# Patient Record
Sex: Female | Born: 1965 | Race: Black or African American | Hispanic: No | Marital: Married | Smoking: Never smoker
Health system: Southern US, Community
[De-identification: ages and names within clinical notes are randomized; demographics above are authoritative.]

## PROBLEM LIST (undated history)

## (undated) DIAGNOSIS — I10 Essential (primary) hypertension: Secondary | ICD-10-CM

## (undated) DIAGNOSIS — E119 Type 2 diabetes mellitus without complications: Secondary | ICD-10-CM

---

## 2007-07-31 ENCOUNTER — Emergency Department: Payer: Self-pay | Admitting: Emergency Medicine

## 2009-03-27 ENCOUNTER — Ambulatory Visit: Payer: Self-pay | Admitting: Family Medicine

## 2009-04-01 ENCOUNTER — Encounter: Payer: Self-pay | Admitting: Family Medicine

## 2009-04-01 ENCOUNTER — Ambulatory Visit: Payer: Self-pay | Admitting: Obstetrics & Gynecology

## 2009-04-01 LAB — CONVERTED CEMR LAB
Basophils Relative: 0 % (ref 0–1)
Eosinophils Absolute: 0 10*3/uL (ref 0.0–0.7)
Hepatitis B Surface Ag: NEGATIVE
MCHC: 32.4 g/dL (ref 30.0–36.0)
MCV: 92.2 fL (ref 78.0–100.0)
Neutro Abs: 2.8 10*3/uL (ref 1.7–7.7)
Neutrophils Relative %: 53 % (ref 43–77)
Platelets: 258 10*3/uL (ref 150–400)
RBC: 3.95 M/uL (ref 3.87–5.11)
WBC: 5.2 10*3/uL (ref 4.0–10.5)
hCG, Beta Chain, Quant, S: 380.6 milliintl units/mL

## 2009-04-03 ENCOUNTER — Ambulatory Visit: Payer: Self-pay | Admitting: Family Medicine

## 2009-04-03 LAB — CONVERTED CEMR LAB: hCG, Beta Chain, Quant, S: 324.7 milliintl units/mL

## 2009-04-08 ENCOUNTER — Encounter: Payer: Self-pay | Admitting: Family Medicine

## 2009-04-08 ENCOUNTER — Ambulatory Visit: Payer: Self-pay | Admitting: Obstetrics and Gynecology

## 2009-04-08 LAB — CONVERTED CEMR LAB: hCG, Beta Chain, Quant, S: 182 milliintl units/mL

## 2009-04-13 ENCOUNTER — Ambulatory Visit: Payer: Self-pay | Admitting: Family Medicine

## 2009-04-13 LAB — CONVERTED CEMR LAB: hCG, Beta Chain, Quant, S: 90.8 milliintl units/mL

## 2009-04-24 ENCOUNTER — Ambulatory Visit: Payer: Self-pay | Admitting: Obstetrics & Gynecology

## 2009-04-24 ENCOUNTER — Encounter: Payer: Self-pay | Admitting: Family Medicine

## 2009-04-24 LAB — CONVERTED CEMR LAB: hCG, Beta Chain, Quant, S: 49.8 milliintl units/mL

## 2009-06-22 ENCOUNTER — Encounter: Payer: Self-pay | Admitting: Family Medicine

## 2009-06-22 ENCOUNTER — Ambulatory Visit: Payer: Self-pay | Admitting: Obstetrics & Gynecology

## 2009-11-16 ENCOUNTER — Emergency Department: Payer: Self-pay | Admitting: Emergency Medicine

## 2010-03-21 ENCOUNTER — Emergency Department: Payer: Self-pay | Admitting: Emergency Medicine

## 2010-05-05 ENCOUNTER — Ambulatory Visit: Payer: Self-pay | Admitting: Family Medicine

## 2012-03-27 ENCOUNTER — Emergency Department: Payer: Self-pay | Admitting: Emergency Medicine

## 2012-04-08 ENCOUNTER — Emergency Department: Payer: Self-pay | Admitting: Emergency Medicine

## 2013-10-27 ENCOUNTER — Emergency Department: Payer: Self-pay | Admitting: Internal Medicine

## 2014-01-10 ENCOUNTER — Emergency Department: Payer: Self-pay | Admitting: Emergency Medicine

## 2015-01-27 ENCOUNTER — Emergency Department: Payer: Self-pay | Admitting: Emergency Medicine

## 2015-08-12 ENCOUNTER — Emergency Department
Admission: EM | Admit: 2015-08-12 | Discharge: 2015-08-12 | Disposition: A | Discharge status: Home / Self Care | Payer: Self-pay | Attending: Emergency Medicine | Admitting: Emergency Medicine

## 2015-08-12 DIAGNOSIS — L600 Ingrowing nail: Secondary | ICD-10-CM | POA: Insufficient documentation

## 2015-08-12 DIAGNOSIS — Y998 Other external cause status: Secondary | ICD-10-CM | POA: Insufficient documentation

## 2015-08-12 DIAGNOSIS — Y9389 Activity, other specified: Secondary | ICD-10-CM | POA: Insufficient documentation

## 2015-08-12 DIAGNOSIS — Y9289 Other specified places as the place of occurrence of the external cause: Secondary | ICD-10-CM | POA: Insufficient documentation

## 2015-08-12 DIAGNOSIS — W228XXA Striking against or struck by other objects, initial encounter: Secondary | ICD-10-CM | POA: Insufficient documentation

## 2015-08-12 MED ORDER — MELOXICAM 15 MG PO TABS: 15.0000 mg | ORAL_TABLET | Freq: Every day | ORAL | Status: DC

## 2015-08-12 NOTE — Discharge Instructions (Signed)
Ingrown Toenail An ingrown toenail occurs when the sharp edge of your toenail grows into the skin. Causes of ingrown toenails include toenails clipped too far back or poorly fitting shoes. Activities involving sudden stops (basketball, tennis) causing "toe jamming" may lead to an ingrown nail. HOME CARE INSTRUCTIONS   Soak the whole foot in warm soapy water for 20 minutes, 3 times per day.  You may lift the edge of the nail away from the sore skin by wedging a small piece of cotton under the corner of the nail. Be careful not to dig (traumatize) and cause more injury to the area.  Wear shoes that fit well. While the ingrown nail is causing problems, sandals may be beneficial.  Trim your toenails regularly and carefully. Cut your toenails straight across, not in a curve. This will prevent injury to the skin at the corners of the toenail.  Keep your feet clean and dry.  Crutches may be helpful early in treatment if walking is painful.  Antibiotics, if prescribed, should be taken as directed.  Return for a wound check in 2 days or as directed.  Only take over-the-counter or prescription medicines for pain, discomfort, or fever as directed by your caregiver. SEEK IMMEDIATE MEDICAL CARE IF:   You have a fever.  You have increasing pain, redness, swelling, or heat at the wound site.  Your toe is not better in 7 days. If conservative treatment is not successful, surgical removal of a portion or all of the nail may be necessary. MAKE SURE YOU:   Understand these instructions.  Will watch your condition.  Will get help right away if you are not doing well or get worse. Document Released: 11/04/2000 Document Revised: 01/30/2012 Document Reviewed: 10/29/2008 [employer] Patient Information 2015 [employer]. This information is not intended to replace advice given to you by your health care provider. Make sure you discuss any questions you have with your health care provider.  

## 2015-08-12 NOTE — ED Provider Notes (Signed)
[facility] Emergency Department Provider Note  ____________________________________________  Time seen: Approximately 6:19 PM  I have reviewed the triage vital signs and the nursing notes.   HISTORY  Chief Complaint Toe Pain    HPI Brittney Rojas is a 49 y.o. female who presents emergency department for complaint of toe pain on right foot. She states thatshe accidentally kicked a door jamb approximately a week ago and now is having pain to the second toe right foot. She states that the pain is all on the lateral and above the nailbed. She denies any gross edema, erythema, or drainage. She has not taken anything for pain prior to arrival. There is no ecchymosis or bruising. There is no pain in the intervening period between injury and presentation.   No past medical history on file.  There are no active problems to display for this patient.   No past surgical history on file.  Current Outpatient Rx  Name  Route  Sig  Dispense  Refill  . meloxicam (MOBIC) 15 MG tablet   Oral   Take 1 tablet (15 mg total) by mouth daily.   30 tablet   0     Allergies Review of patient's allergies indicates no known allergies.  No family history on file.  Social History Social History  Substance Use Topics  . Smoking status: Not on file  . Smokeless tobacco: Not on file  . Alcohol Use: Not on file    Review of Systems Constitutional: No fever/chills Eyes: No visual changes. ENT: No sore throat. Cardiovascular: Denies chest pain. Respiratory: Denies shortness of breath. Gastrointestinal: No abdominal pain.  No nausea, no vomiting.  No diarrhea.  No constipation. Genitourinary: Negative for dysuria. Musculoskeletal: Negative for back pain. Pain to the second toe on the right foot lateral aspect. Skin: Negative for rash.  Neurological: Negative for headaches, focal weakness or numbness.  10-point ROS otherwise  negative.  ____________________________________________   PHYSICAL EXAM:  VITAL SIGNS: ED Triage Vitals  Enc Vitals Group     BP 08/12/15 1733 141/89 mmHg     Pulse Rate 08/12/15 1733 82     Resp 08/12/15 1733 17     Temp 08/12/15 1733 97.9 F (36.6 C)     Temp Source 08/12/15 1733 Oral     SpO2 08/12/15 1733 100 %     Weight 08/12/15 1733 170 lb (77.111 kg)     Height 08/12/15 1733  (1.626 m)     Head Cir --      Peak Flow --      Pain Score 08/12/15 1748 6     Pain Loc --      Pain Edu? --      Excl. in GC? --     Constitutional: Alert and oriented. Well appearing and in no acute distress. Eyes: Conjunctivae are normal. PERRL. EOMI. Head: Atraumatic. Nose: No congestion/rhinnorhea. Mouth/Throat: Mucous membranes are moist.  Oropharynx non-erythematous. Neck: No stridor.   Cardiovascular: Normal rate, regular rhythm. Grossly normal heart sounds.  Good peripheral circulation. Respiratory: Normal respiratory effort.  No retractions. Lungs CTAB. Gastrointestinal: Soft and nontender. No distention. No abdominal bruits. No CVA tenderness. Musculoskeletal: No lower extremity tenderness nor edema.  No joint effusions. Neurologic:  Normal speech and language. No gross focal neurologic deficits are appreciated. No gait instability. Skin:  Skin is warm, dry and intact. No rash noted. Mild erythema noted to the lateral aspect of the nailbed of the second toe on the right  foot. No fluctuance noted. No edema noted. No drainage noted. Psychiatric: Mood and affect are normal. Speech and behavior are normal.  ____________________________________________   LABS (all labs ordered are listed, but only abnormal results are displayed)  Labs Reviewed - No data to display ____________________________________________  EKG   ____________________________________________  RADIOLOGY   ____________________________________________   PROCEDURES  Procedure(s) performed:  None  Critical Care performed: No  ____________________________________________   INITIAL IMPRESSION / ASSESSMENT AND PLAN / ED COURSE  Pertinent labs & imaging results that were available during my care of the patient were reviewed by me and considered in my medical decision making (see chart for details).  Patient's history, symptoms, and exam most consistent with a mildly ingrown toenail. No signs of infection at this time. Discussed options with patient she states that she would like to treat with anti-inflammatory/pain medication and refer toenail to grow out. She does not want removal at this time. We'll prescribe anti-inflammatories for patient and gave instructions on how to have proper nail care. Patient verbalizes understanding and states compliance with structures.  FINAL CLINICAL IMPRESSION(S) / ED DIAGNOSES  Final diagnoses:  Ingrown toenail without infection      Racheal Patches, PA-C 08/12/15 1900  Sharyn Creamer, MD 08/12/15 2217

## 2015-08-12 NOTE — ED Notes (Signed)
Possible ingrown toenail

## 2016-10-21 ENCOUNTER — Emergency Department: Payer: Federal, State, Local not specified - PPO

## 2016-10-21 ENCOUNTER — Emergency Department
Admission: EM | Admit: 2016-10-21 | Discharge: 2016-10-21 | Disposition: A | Discharge status: Home / Self Care | Payer: Federal, State, Local not specified - PPO | Attending: Emergency Medicine | Admitting: Emergency Medicine

## 2016-10-21 ENCOUNTER — Encounter: Payer: Self-pay | Admitting: Emergency Medicine

## 2016-10-21 DIAGNOSIS — M20011 Mallet finger of right finger(s): Secondary | ICD-10-CM | POA: Insufficient documentation

## 2016-10-21 DIAGNOSIS — M79644 Pain in right finger(s): Secondary | ICD-10-CM | POA: Diagnosis present

## 2016-10-21 MED ORDER — MELOXICAM 7.5 MG PO TABS: 7.5000 mg | ORAL_TABLET | Freq: Every day | ORAL | 2 refills | Status: AC

## 2016-10-21 MED ORDER — KETOROLAC TROMETHAMINE 60 MG/2ML IM SOLN
60.0000 mg | Freq: Once | INTRAMUSCULAR | Status: AC
  Administered 2016-10-21: 60 mg via INTRAMUSCULAR
  Filled 2016-10-21: qty 2

## 2016-10-21 NOTE — ED Triage Notes (Signed)
Pt ambulatory to triage with no difficulty. Pt reports about 45 mins ago she was cleaning in her car when she heard a pop in her right hand 3rd digit. Finger is swollen and painful.

## 2016-10-22 NOTE — ED Provider Notes (Signed)
[facility] Emergency Department Provider Note ____________________________________________  Time seen: Approximately 12:23 AM  I have reviewed the triage vital signs and the nursing notes.   HISTORY  Chief Complaint Finger Injury    HPI Brittney Rojas is a 50 y.o. female presenting with pain and edema of the right third digit. Patient states that she was cleaning when she heard a "pop". Digit became painful. She denies prior incidences of trauma or surgeries to the aforementioned digit. She is experiencing hand avoidence. Pain is currently 5 out of 10 in intensity and described as throbbing. She has not attempted alleviating measures. 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  No past medical history on file.  There are no active problems to display for this patient.   No past surgical history on file.  Prior to Admission medications   Medication Sig Start Date End Date Taking? Authorizing Provider  meloxicam (MOBIC) 7.5 MG tablet Take 1 tablet (7.5 mg total) by mouth daily. 10/21/16 10/21/17  Orvil FeilJaclyn M Yuval Nolet, PA-C    Allergies Patient has no known allergies.  No family history on file.  Social History Social History  Substance Use Topics  . Smoking status: Not on file  . Smokeless tobacco: Not on file  . Alcohol use Not on file    Review of Systems Constitutional: No recent illness. Cardiovascular: Denies chest pain or palpitations. Respiratory: Denies shortness of breath. Musculoskeletal: Pain in right 3rd finger.  Skin: Negative for rash, wound, lesion. Neurological: Negative for focal weakness or numbness.  ____________________________________________   PHYSICAL EXAM:  VITAL SIGNS: ED Triage Vitals  Enc Vitals Group     BP 10/21/16 1935 (!) 143/86     Pulse Rate 10/21/16 1935 73     Resp 10/21/16 1935 18     Temp 10/21/16 1935 97.5 F (36.4 C)     Temp Source 10/21/16 1935 Oral     SpO2 10/21/16  1935 96 %     Weight 10/21/16 1926 170 lb (77.1 kg)     Height 10/21/16 1926 5\' 4"  (1.626 m)     Head Circumference --      Peak Flow --      Pain Score 10/21/16 1926 7     Pain Loc --      Pain Edu? --      Excl. in GC? --     Constitutional: Alert and oriented. Well appearing and in no acute distress. Eyes: Conjunctivae are normal. EOMI. Head: Atraumatic. Neck: No stridor.  Respiratory: Normal respiratory effort.   Musculoskeletal: 3rd right digit is edematous. Patient has flexion at the DIP and PIP joint within reference range, right 3rd digit. Patient has approximately 8 of extension lag at the DIP joint, right 3rd digit. Patient exhibits full range of motion at the wrist. Radial and ulnar pulses are palpable.  Neurologic:  Normal speech and language. No gross focal neurologic deficits are appreciated. Speech is normal. No gait instability. Skin:  Skin is warm, dry and intact. Atraumatic.  Psychiatric: Mood and affect are normal. Speech and behavior are normal.  ____________________________________________   LABS (all labs ordered are listed, but only abnormal results are displayed)  Labs Reviewed - No data to display ____________________________________________  RADIOLOGY  There is no evidence of fracture or dislocation. There is no  evidence of arthropathy or other focal bone abnormality. Soft  tissues are unremarkable.     PROCEDURES  Procedure(s) performed:  Toradol  Splint application    ____________________________________________   INITIAL IMPRESSION / ASSESSMENT AND PLAN / ED COURSE  Clinical Course     Pertinent labs & imaging results that were available during my care of the patient were reviewed by me and considered in my medical decision making (see chart for details).  Assessment and plan: Patient has 8 of extension lag in the right third digit, consistent with a mallet injury. X-rays of the affected digit did not reveal fractures or  dislocations. Patient's right third finger was splinted in extension. A referral was made to the orthopedist on-call, Dr. Rosita KeaMenz Patient was advised to make an appointment for Monday morning. Mobic was prescribed to be used as needed for pain and inflammation. Patient's vital signs are reassuring at this time. All patient questions were answered.  NAL CLINICAL IMPRESSION(S) / ED DIAGNOSES  Final diagnoses:  Mallet finger of right hand       Orvil FeilJaclyn M Kc Summerson, PA-C 10/22/16 0032    Phineas SemenGraydon Goodman, MD 10/23/16 [phone]

## 2016-12-03 ENCOUNTER — Emergency Department
Admission: EM | Admit: 2016-12-03 | Discharge: 2016-12-03 | Disposition: A | Discharge status: Home / Self Care | Payer: Federal, State, Local not specified - PPO | Attending: Emergency Medicine | Admitting: Emergency Medicine

## 2016-12-03 ENCOUNTER — Encounter: Payer: Self-pay | Admitting: Emergency Medicine

## 2016-12-03 DIAGNOSIS — J111 Influenza due to unidentified influenza virus with other respiratory manifestations: Secondary | ICD-10-CM | POA: Insufficient documentation

## 2016-12-03 DIAGNOSIS — R05 Cough: Secondary | ICD-10-CM | POA: Diagnosis present

## 2016-12-03 DIAGNOSIS — J069 Acute upper respiratory infection, unspecified: Secondary | ICD-10-CM

## 2016-12-03 LAB — INFLUENZA PANEL BY PCR (TYPE A & B)
Influenza A By PCR: POSITIVE — AB
Influenza B By PCR: NEGATIVE

## 2016-12-03 MED ORDER — BENZONATATE 100 MG PO CAPS: 100.0000 mg | ORAL_CAPSULE | Freq: Three times a day (TID) | ORAL | 0 refills | Status: DC | PRN

## 2016-12-03 MED ORDER — ACETAMINOPHEN-CODEINE #3 300-30 MG PO TABS
1.0000 | ORAL_TABLET | Freq: Once | ORAL | Status: AC
  Administered 2016-12-03: 1 via ORAL
  Filled 2016-12-03: qty 1

## 2016-12-03 MED ORDER — ACETAMINOPHEN-CODEINE #3 300-30 MG PO TABS: 1.0000 | ORAL_TABLET | Freq: Three times a day (TID) | ORAL | 0 refills | Status: DC | PRN

## 2016-12-03 MED ORDER — ONDANSETRON 4 MG PO TBDP: 4.0000 mg | ORAL_TABLET | Freq: Four times a day (QID) | ORAL | 0 refills | Status: DC | PRN

## 2016-12-03 NOTE — ED Notes (Signed)
See triage ntote  Developed body aches and headache on weds  Unsure of fever  But has had chills  Positive cough no n/v

## 2016-12-03 NOTE — Discharge Instructions (Signed)
You likely have the flu (influenza virus infection). You may have symptoms for several more days. It is important to stay hydrated during this illness. You may take the prescription meds as directed. In addition, you should dose OTC Delsym (dextromethorphan) cough syrup. You may also take Ibuprofen in addition to the Tylenol w/ codeine you have been prescribed. Follow-up with your provider or return to the ED if symptoms worsen.

## 2016-12-03 NOTE — ED Triage Notes (Signed)
Patient presents to the ED with cough, congestion, body aches and cold chills since Wednesday.  Patient reports never taking her temp. But feeling that she was very hot and very cold.  Patient is in no obvious distress at this time.

## 2016-12-04 NOTE — ED Provider Notes (Signed)
[facility] Emergency Department Provider Note ____________________________________________  Time seen: 1247  I have reviewed the triage vital signs and the nursing notes.  HISTORY  Chief Complaint  Fever; Headache; and Nasal Congestion  HPI Brittney Rojas is a 51 y.o. female presents to the ED for evaluation of cough, congestion, and body aches since Wednesday. Patient is not taking her temperature but she has had subjective fevers and chills. She is taken Alka-Seltzer elixir for symptom relief but notes limited benefit. She denies any nausea, or vomiting in the interim. She did not receive the seasonal flu vaccine.  History reviewed. No pertinent past medical history.  There are no active problems to display for this patient.  History reviewed. No pertinent surgical history.  Prior to Admission medications   Medication Sig Start Date End Date Taking? Authorizing Provider  acetaminophen-codeine (TYLENOL #3) 300-30 MG tablet Take 1 tablet by mouth every 8 (eight) hours as needed for moderate pain. 12/03/16   Sergei Delo V Bacon Kiira Brach, PA-C  benzonatate (TESSALON PERLES) 100 MG capsule Take 1 capsule (100 mg total) by mouth 3 (three) times daily as needed for cough (Take 1-2 per dose). 12/03/16   Jayceon Troy V Bacon Mackenzye Mackel, PA-C  meloxicam (MOBIC) 7.5 MG tablet Take 1 tablet (7.5 mg total) by mouth daily. 10/21/16 10/21/17  Dayna BarkerJaclyn M Woods, PA-C  ondansetron (ZOFRAN ODT) 4 MG disintegrating tablet Take 1 tablet (4 mg total) by mouth every 6 (six) hours as needed for nausea or vomiting. 12/03/16   Charlesetta IvoryJenise V Bacon Maud Rubendall, PA-C   Allergies Patient has no known allergies.  No family history on file.  Social History Social History  Substance Use Topics  . Smoking status: Never Smoker  . Smokeless tobacco: Never Used  . Alcohol use No    Review of Systems  Constitutional: Positive for subjective fevers. Eyes: Negative for visual changes. ENT: Negative for sore  throat. Cardiovascular: Negative for chest pain. Respiratory: Negative for shortness of breath. Reports cough as above Gastrointestinal: Negative for abdominal pain, vomiting and diarrhea. Musculoskeletal: Negative for back pain. Reports bodyaches Skin: Negative for rash. Neurological: Negative for headaches, focal weakness or numbness. ____________________________________________  PHYSICAL EXAM:  VITAL SIGNS: ED Triage Vitals [12/03/16 1141]  Enc Vitals Group     BP (!) 151/77     Pulse Rate (!) 106     Resp 18     Temp 98.2 F (36.8 C)     Temp Source Oral     SpO2 95 %     Weight 170 lb (77.1 kg)     Height 5\' 4"  (1.626 m)     Head Circumference      Peak Flow      Pain Score 7     Pain Loc      Pain Edu?      Excl. in GC?    Constitutional: Alert and oriented. Well appearing and in no distress. Head: Normocephalic and atraumatic. Eyes: Conjunctivae are normal. PERRL. Normal extraocular movements Ears: Canals clear. TMs intact bilaterally. Nose: No congestion/rhinorrhea/epistaxis. Mouth/Throat: Mucous membranes are moist. Uvula is midline and tonsils are flat. Neck: Supple. No thyromegaly. Hematological/Lymphatic/Immunological: No cervical lymphadenopathy. Cardiovascular: Normal rate, regular rhythm. Normal distal pulses. Respiratory: Normal respiratory effort. No wheezes/rales/rhonchi. Gastrointestinal: Soft and nontender. No distention. Musculoskeletal: Nontender with normal range of motion in all extremities.  Neurologic:  Normal gait without ataxia. Normal speech and language. No gross focal neurologic deficits are appreciated. Skin:  Skin is warm, dry and intact. No  rash noted. Psychiatric: Mood and affect are normal. Patient exhibits appropriate insight and judgment. ____________________________________________    LABS (pertinent positives/negatives) Labs Reviewed  INFLUENZA PANEL BY PCR (TYPE A & B, H1N1) - Abnormal; Notable for the following:       Result  Value   Influenza A By PCR POSITIVE (*)    All other components within normal limits  ___________________________________________  PROCEDURES  Tylenol #3 i PO ____________________________________________  INITIAL IMPRESSION / ASSESSMENT AND PLAN / ED COURSE  Patient with clinical picture and laboratory confirmation of influenza A. She is discharged with prescriptions for Tessalon pearls, Zofran, and Tylenol #3. She is encouraged to rest, hydrate, and take over-the-counter cough medicine. She will follow-up with her primary care provider for continued and ongoing symptoms. Return precautions are reviewed.  Clinical Course    ____________________________________________  FINAL CLINICAL IMPRESSION(S) / ED DIAGNOSES  Final diagnoses:  Viral upper respiratory tract infection  Influenza      Lissa Hoard, PA-C 12/04/16 1916    Sharman Cheek, MD 12/04/16 2028

## 2017-05-31 ENCOUNTER — Other Ambulatory Visit: Payer: Self-pay | Admitting: Physician Assistant

## 2017-05-31 DIAGNOSIS — Z1231 Encounter for screening mammogram for malignant neoplasm of breast: Secondary | ICD-10-CM

## 2017-07-10 ENCOUNTER — Ambulatory Visit
Admission: RE | Admit: 2017-07-10 | Discharge: 2017-07-10 | Disposition: A | Discharge status: Home / Self Care | Payer: Federal, State, Local not specified - PPO | Source: Ambulatory Visit | Attending: Physician Assistant | Admitting: Physician Assistant

## 2017-07-10 DIAGNOSIS — Z1231 Encounter for screening mammogram for malignant neoplasm of breast: Secondary | ICD-10-CM | POA: Insufficient documentation

## 2017-07-10 DIAGNOSIS — R928 Other abnormal and inconclusive findings on diagnostic imaging of breast: Secondary | ICD-10-CM | POA: Diagnosis not present

## 2017-07-14 ENCOUNTER — Other Ambulatory Visit: Payer: Self-pay | Admitting: Physician Assistant

## 2017-07-14 DIAGNOSIS — N6489 Other specified disorders of breast: Secondary | ICD-10-CM

## 2017-07-14 DIAGNOSIS — R928 Other abnormal and inconclusive findings on diagnostic imaging of breast: Secondary | ICD-10-CM

## 2017-07-26 ENCOUNTER — Ambulatory Visit
Admission: RE | Admit: 2017-07-26 | Discharge: 2017-07-26 | Disposition: A | Discharge status: Home / Self Care | Payer: Federal, State, Local not specified - PPO | Source: Ambulatory Visit | Attending: Physician Assistant | Admitting: Physician Assistant

## 2017-07-26 DIAGNOSIS — N6489 Other specified disorders of breast: Secondary | ICD-10-CM

## 2017-07-26 DIAGNOSIS — N6320 Unspecified lump in the left breast, unspecified quadrant: Secondary | ICD-10-CM | POA: Insufficient documentation

## 2017-07-26 DIAGNOSIS — R928 Other abnormal and inconclusive findings on diagnostic imaging of breast: Secondary | ICD-10-CM

## 2017-08-07 ENCOUNTER — Other Ambulatory Visit: Payer: Self-pay | Admitting: Physician Assistant

## 2017-08-07 DIAGNOSIS — R928 Other abnormal and inconclusive findings on diagnostic imaging of breast: Secondary | ICD-10-CM

## 2017-08-07 DIAGNOSIS — N6320 Unspecified lump in the left breast, unspecified quadrant: Secondary | ICD-10-CM

## 2017-08-11 ENCOUNTER — Ambulatory Visit
Admission: RE | Admit: 2017-08-11 | Discharge: 2017-08-11 | Disposition: A | Discharge status: Home / Self Care | Payer: Federal, State, Local not specified - PPO | Source: Ambulatory Visit | Attending: Physician Assistant | Admitting: Physician Assistant

## 2017-08-11 DIAGNOSIS — N6320 Unspecified lump in the left breast, unspecified quadrant: Secondary | ICD-10-CM | POA: Diagnosis present

## 2017-08-11 DIAGNOSIS — R928 Other abnormal and inconclusive findings on diagnostic imaging of breast: Secondary | ICD-10-CM

## 2017-08-11 DIAGNOSIS — N6312 Unspecified lump in the right breast, upper inner quadrant: Secondary | ICD-10-CM | POA: Diagnosis not present

## 2017-08-11 DIAGNOSIS — N62 Hypertrophy of breast: Secondary | ICD-10-CM | POA: Insufficient documentation

## 2017-08-11 DIAGNOSIS — R599 Enlarged lymph nodes, unspecified: Secondary | ICD-10-CM | POA: Diagnosis present

## 2017-08-11 HISTORY — PX: BREAST BIOPSY: SHX20

## 2017-08-14 LAB — SURGICAL PATHOLOGY

## 2018-01-10 ENCOUNTER — Emergency Department
Admission: EM | Admit: 2018-01-10 | Discharge: 2018-01-10 | Disposition: A | Discharge status: Home / Self Care | Payer: Federal, State, Local not specified - PPO | Attending: Emergency Medicine | Admitting: Emergency Medicine

## 2018-01-10 ENCOUNTER — Other Ambulatory Visit: Payer: Self-pay

## 2018-01-10 ENCOUNTER — Encounter: Payer: Self-pay | Admitting: Intensive Care

## 2018-01-10 DIAGNOSIS — R739 Hyperglycemia, unspecified: Secondary | ICD-10-CM | POA: Diagnosis not present

## 2018-01-10 DIAGNOSIS — R42 Dizziness and giddiness: Secondary | ICD-10-CM | POA: Diagnosis present

## 2018-01-10 DIAGNOSIS — Z79899 Other long term (current) drug therapy: Secondary | ICD-10-CM | POA: Insufficient documentation

## 2018-01-10 DIAGNOSIS — R9431 Abnormal electrocardiogram [ECG] [EKG]: Secondary | ICD-10-CM | POA: Insufficient documentation

## 2018-01-10 LAB — URINALYSIS, COMPLETE (UACMP) WITH MICROSCOPIC
Bacteria, UA: NONE SEEN
Bilirubin Urine: NEGATIVE
GLUCOSE, UA: 50 mg/dL — AB
HGB URINE DIPSTICK: NEGATIVE
Ketones, ur: NEGATIVE mg/dL
Leukocytes, UA: NEGATIVE
NITRITE: NEGATIVE
Protein, ur: NEGATIVE mg/dL
SPECIFIC GRAVITY, URINE: 1.015 (ref 1.005–1.030)
pH: 6 (ref 5.0–8.0)

## 2018-01-10 LAB — BASIC METABOLIC PANEL
Anion gap: 11 (ref 5–15)
BUN: 11 mg/dL (ref 6–20)
CALCIUM: 8.8 mg/dL — AB (ref 8.9–10.3)
CO2: 25 mmol/L (ref 22–32)
CREATININE: 0.94 mg/dL (ref 0.44–1.00)
Chloride: 102 mmol/L (ref 101–111)
GFR calc Af Amer: >60 mL/min (ref >60)
GLUCOSE: 261 mg/dL — AB (ref 65–99)
Potassium: 4.2 mmol/L (ref 3.5–5.1)
Sodium: 138 mmol/L (ref 135–145)

## 2018-01-10 LAB — PREGNANCY, URINE: Preg Test, Ur: NEGATIVE

## 2018-01-10 LAB — CBC
HCT: 40.9 % (ref 35.0–47.0)
Hemoglobin: 13.8 g/dL (ref 12.0–16.0)
MCH: 30.6 pg (ref 26.0–34.0)
MCHC: 33.6 g/dL (ref 32.0–36.0)
MCV: 91 fL (ref 80.0–100.0)
PLATELETS: 214 10*3/uL (ref 150–440)
RBC: 4.5 MIL/uL (ref 3.80–5.20)
RDW: 13.5 % (ref 11.5–14.5)
WBC: 4.3 10*3/uL (ref 3.6–11.0)

## 2018-01-10 LAB — TROPONIN I

## 2018-01-10 MED ORDER — KETOROLAC TROMETHAMINE 60 MG/2ML IM SOLN: 60.0000 mg | Freq: Once | INTRAMUSCULAR | Status: DC

## 2018-01-10 MED ORDER — IBUPROFEN 400 MG PO TABS
400.0000 mg | ORAL_TABLET | Freq: Once | ORAL | Status: AC | PRN
  Administered 2018-01-10: 400 mg via ORAL
  Filled 2018-01-10: qty 1

## 2018-01-10 MED ORDER — KETOROLAC TROMETHAMINE 60 MG/2ML IM SOLN
30.0000 mg | Freq: Once | INTRAMUSCULAR | Status: AC
  Administered 2018-01-10: 30 mg via INTRAMUSCULAR
  Filled 2018-01-10: qty 2

## 2018-01-10 NOTE — ED Provider Notes (Addendum)
[facility] Emergency Department Provider Note  ____________________________________________  Time seen: Approximately 2:22 PM  I have reviewed the triage vital signs and the nursing notes.   HISTORY  Chief Complaint Dizziness and Nausea    HPI Brittney Rojas is a 52 y.o. female, with a history of recurrent migraines, presenting with headache, lightheadedness, nausea.  The patient reports that since yesterday, she has had a progressively worsening frontal headache which is typical of her prior headaches.  However, she is having postural lightheadedness which is unusual.  In addition she has had nausea without vomiting.  She denies any cough or cold symptoms, fever or chills, abdominal pain, diarrhea or constipation.  She has had no numbness tingling or weakness.  She has felt slightly off balance when she is walking.  She has not tried anything for her headache, but Motrin has helped her in the past.  Here, the patient is hyperglycemic, and she does not have any history of diabetes but this does run in her family.  FH: DM  History reviewed. No pertinent past medical history.  There are no active problems to display for this patient.   Past Surgical History:  Procedure Laterality Date  . BREAST BIOPSY Left 08/11/2017   lt us core 2 areas path pend    Current Outpatient Rx  . Order #: [id number] Class: Print  . Order #: [id number] Class: Print  . Order #: [id number] Class: Print    Allergies Patient has no known allergies.  Family History  Problem Relation Age of Onset  . Breast cancer Paternal Aunt [id number]    Social History Social History   Tobacco Use  . Smoking status: Never Smoker  . Smokeless tobacco: Never Used  Substance Use Topics  . Alcohol use: No  . Drug use: No    Review of Systems Constitutional: No fever/chills.  Positive postural lightheadedness.  Negative syncope.  No trauma.  No travel outside the [place].  No tick bites. Eyes:  No visual changes.  No blurred or double vision. ENT: No sore throat. No congestion or rhinorrhea. Cardiovascular: Denies chest pain. Denies palpitations. Respiratory: Denies shortness of breath.  No cough. Gastrointestinal: No abdominal pain.  Positive nausea, no vomiting.  No diarrhea.  No constipation. Genitourinary: Negative for dysuria.  Positive urinary frequency.  LMP last year. Musculoskeletal: Negative for back pain. Skin: Negative for rash. Neurological: Negative for headaches. No focal numbness, tingling or weakness.  Endocrine: Positive polyuria and polydipsia.   ____________________________________________   PHYSICAL EXAM:  VITAL SIGNS: ED Triage Vitals  Enc Vitals Group     BP 01/10/18 1132 (!) 142/94     Pulse Rate 01/10/18 1132 80     Resp 01/10/18 1132 16     Temp 01/10/18 1132 98.2 F (36.8 C)     Temp Source 01/10/18 1132 Oral     SpO2 01/10/18 1132 98 %     Weight 01/10/18 1133 197 lb (89.4 kg)     Height 01/10/18 1133 5\' 4"  (1.626 m)     Head Circumference --      Peak Flow --      Pain Score 01/10/18 1132 10     Pain Loc --      Pain Edu? --      Excl. in [place]? --     Constitutional: Alert and oriented. Well appearing and in no acute distress. Answers questions appropriately. Eyes: Conjunctivae are normal.  EOMI. No scleral icterus. Head: Atraumatic. Nose: No congestion/rhinnorhea. Mouth/Throat: Mucous membranes  are moist.  Neck: No stridor.  Supple.  No JVD. No meningismus. Cardiovascular: Normal rate, regular rhythm. No murmurs, rubs or gallops.  Respiratory: Normal respiratory effort.  No accessory muscle use or retractions. Lungs CTAB.  No wheezes, rales or ronchi. Gastrointestinal: Soft, nontender and nondistended.  No guarding or rebound.  No peritoneal signs. Musculoskeletal: No LE edema. No ttp in the calves or palpable cords.  Negative Homan's sign. Neurologic: Alert and oriented 3. Speech is clear.  Face and smile symmetric. Tongue is  midline.  EOMI.  PERRLA.  No pronator drift. 5 out of 5 grip, biceps, triceps, hip flexors, plantar flexion and dorsiflexion. Normal sensation to light touch in the bilateral upper and lower extremities, and face. Normal gait without ataxia. Skin:  Skin is warm, dry and intact. No rash noted. Psychiatric: Mood and affect are normal. Speech and behavior are normal.  Normal judgement.  ____________________________________________   LABS (all labs ordered are listed, but only abnormal results are displayed)  Labs Reviewed  BASIC METABOLIC PANEL - Abnormal; Notable for the following components:      Result Value   Glucose, Bld 261 (*)    Calcium 8.8 (*)    All other components within normal limits  URINALYSIS, COMPLETE (UACMP) WITH MICROSCOPIC - Abnormal; Notable for the following components:   Color, Urine YELLOW (*)    APPearance CLEAR (*)    Glucose, UA 50 (*)    Squamous Epithelial / LPF 0-5 (*)    All other components within normal limits  CBC  PREGNANCY, URINE  TROPONIN I   ____________________________________________  EKG  ED ECG REPORT I, Rockne Menghini, the attending physician, personally viewed and interpreted this ECG.   Date: 01/10/2018  EKG Time: 1137  Rate: 79  Rhythm: normal sinus rhythm  Axis: leftward  Intervals:none  ST&T Change: No STEMI; poor baseline tracing.  Nonspecific T wave inversions in V1, V2 and V3.  We have no prior EKGs for comparison.  ED ECG REPORT I, Rockne Menghini, the attending physician, personally viewed and interpreted this ECG.   Date: 01/10/2018  EKG Time: 1443  Rate: 69  Rhythm: normal sinus rhythm  Axis: leftward  Intervals:none  ST&T Change: no STEMI; persistent nonspecific T wave inversions without dynamic changes.     ____________________________________________  RADIOLOGY  No results found.  ____________________________________________   PROCEDURES  Procedure(s) performed:  None  Procedures  Critical Care performed: No ____________________________________________   INITIAL IMPRESSION / ASSESSMENT AND PLAN / ED COURSE  Pertinent labs & imaging results that were available during my care of the patient were reviewed by me and considered in my medical decision making (see chart for details).  52 y.o. email with a history of migraines presenting with typical frontal headache, but new symptom of postural lightheadedness with nausea, as well as new hyperglycemia here with polyuria and polydipsia.  Overall, the patient is hemodynamically stable and afebrile.  I do not see any focal neurologic deficits on her examination it is unlikely that her symptoms are related to an acute CVA.  The patient has no evidence of MI or significant ischemia on her EKG, but she does have some T wave inversions as well as a left for access and we have no comparison for prior.  I will make a photocopy of this EKG for her to bring with her to her primary care physician and will get a troponin.  If the patient has negative troponin and is not having any chest pain or other  red flag symptoms, ACS or MI is very unlikely.   I am concerned about her hyperglycemia and although she has no evidence of DKA, have spoken with her primary care provider at [facility], who will follow her up with a hemoglobin A1c within the next couple of days.  Her primary care physician was able to pull up her prior blood sugars, and she generally runs within or close to normal limits.  Here, I will encourage her to drink plenty of fluid.    I am awaiting the results of her urine test to rule out UTI and pregnancy.  Orthostatics are also pending.  Plan reevaluation for final disposition.  ----------------------------------------- 3:46 PM on 01/10/2018 -----------------------------------------  The patient's pregnancy test is negative and her urinalysis is not consistent with UTI.  Her repeat EKG is also reassuring.  She  is not orthostatic.  At this time, the patient is safe for discharge.  She understands the results of her studies, follow-up instructions as well as return precautions.  ____________________________________________  FINAL CLINICAL IMPRESSION(S) / ED DIAGNOSES  Final diagnoses:  Hyperglycemia  Postural lightheadedness  ECG abnormal         NEW MEDICATIONS STARTED DURING THIS VISIT:  New Prescriptions   No medications on file      Rockne Menghini, MD 01/10/18 1546    Rockne Menghini, MD 01/10/18 [phone]

## 2018-01-10 NOTE — ED Notes (Signed)
Lab called and informed of troponin add-on to initial blood work.  Lab verbalized understanding.

## 2018-01-10 NOTE — Discharge Instructions (Signed)
It is imperative that you follow-up with your primary care physician to have your blood sugar rechecked and to be tested for diabetes.  In addition, you have some abnormalities in your EKG. Please bring a copy of your EKG from your ER visit today, to your primary care physician's office, so that she may compare this to your prior EKGs.  It is possible that she may refer you for an echocardiogram which is an ultrasound of your heart.  Please return to the emergency department if you develop lightheadedness, severe pain, numbness tingling or weakness, inability to walk, fever, chest pain, palpitations, fainting, or any other symptoms concerning to you.

## 2018-01-10 NOTE — ED Triage Notes (Signed)
Patient reports she has been feeling dizzy and nauseas that started last night with headache. States "I feel like I could pass out today" denies LOC or trauma/injury to head. Denies taking blood thinners. Able to speak full sentences and have conversation during triage. A&O x4

## 2018-07-05 ENCOUNTER — Other Ambulatory Visit: Payer: Self-pay | Admitting: Physician Assistant

## 2018-07-05 DIAGNOSIS — Z1231 Encounter for screening mammogram for malignant neoplasm of breast: Secondary | ICD-10-CM

## 2018-07-30 ENCOUNTER — Ambulatory Visit
Admission: RE | Admit: 2018-07-30 | Discharge: 2018-07-30 | Disposition: A | Discharge status: Home / Self Care | Payer: Federal, State, Local not specified - PPO | Source: Ambulatory Visit | Attending: Physician Assistant | Admitting: Physician Assistant

## 2018-07-30 DIAGNOSIS — Z1231 Encounter for screening mammogram for malignant neoplasm of breast: Secondary | ICD-10-CM | POA: Diagnosis not present

## 2019-07-01 ENCOUNTER — Other Ambulatory Visit: Payer: Self-pay | Admitting: Physician Assistant

## 2019-07-01 DIAGNOSIS — Z1231 Encounter for screening mammogram for malignant neoplasm of breast: Secondary | ICD-10-CM

## 2019-08-05 ENCOUNTER — Ambulatory Visit
Admission: RE | Admit: 2019-08-05 | Discharge: 2019-08-05 | Disposition: A | Discharge status: Home / Self Care | Payer: Federal, State, Local not specified - PPO | Source: Ambulatory Visit | Attending: Physician Assistant | Admitting: Physician Assistant

## 2019-08-05 DIAGNOSIS — Z1231 Encounter for screening mammogram for malignant neoplasm of breast: Secondary | ICD-10-CM

## 2019-08-12 ENCOUNTER — Other Ambulatory Visit: Payer: Self-pay | Admitting: Physician Assistant

## 2019-08-12 DIAGNOSIS — N6489 Other specified disorders of breast: Secondary | ICD-10-CM

## 2019-08-12 DIAGNOSIS — R928 Other abnormal and inconclusive findings on diagnostic imaging of breast: Secondary | ICD-10-CM

## 2019-08-19 ENCOUNTER — Ambulatory Visit
Admission: RE | Admit: 2019-08-19 | Discharge: 2019-08-19 | Disposition: A | Discharge status: Home / Self Care | Payer: Federal, State, Local not specified - PPO | Source: Ambulatory Visit | Attending: Physician Assistant | Admitting: Physician Assistant

## 2019-08-19 DIAGNOSIS — N6489 Other specified disorders of breast: Secondary | ICD-10-CM | POA: Insufficient documentation

## 2019-08-19 DIAGNOSIS — R928 Other abnormal and inconclusive findings on diagnostic imaging of breast: Secondary | ICD-10-CM | POA: Diagnosis present

## 2019-10-26 ENCOUNTER — Emergency Department
Admission: EM | Admit: 2019-10-26 | Discharge: 2019-10-26 | Disposition: A | Discharge status: Home / Self Care | Payer: Federal, State, Local not specified - PPO | Attending: Emergency Medicine | Admitting: Emergency Medicine

## 2019-10-26 ENCOUNTER — Other Ambulatory Visit: Payer: Self-pay

## 2019-10-26 ENCOUNTER — Emergency Department: Payer: Federal, State, Local not specified - PPO

## 2019-10-26 DIAGNOSIS — N939 Abnormal uterine and vaginal bleeding, unspecified: Secondary | ICD-10-CM | POA: Diagnosis not present

## 2019-10-26 LAB — CBC WITH DIFFERENTIAL/PLATELET
Abs Immature Granulocytes: 0.01 10*3/uL (ref 0.00–0.07)
Basophils Absolute: 0 10*3/uL (ref 0.0–0.1)
Basophils Relative: 1 %
Eosinophils Absolute: 0.1 10*3/uL (ref 0.0–0.5)
Eosinophils Relative: 1 %
HCT: 37.8 % (ref 36.0–46.0)
Hemoglobin: 12.5 g/dL (ref 12.0–15.0)
Immature Granulocytes: 0 %
Lymphocytes Relative: 36 %
Lymphs Abs: 1.4 10*3/uL (ref 0.7–4.0)
MCH: 29.9 pg (ref 26.0–34.0)
MCHC: 33.1 g/dL (ref 30.0–36.0)
MCV: 90.4 fL (ref 80.0–100.0)
Monocytes Absolute: 0.4 10*3/uL (ref 0.1–1.0)
Monocytes Relative: 11 %
Neutro Abs: 2.1 10*3/uL (ref 1.7–7.7)
Neutrophils Relative %: 51 %
Platelets: 268 10*3/uL (ref 150–400)
RBC: 4.18 MIL/uL (ref 3.87–5.11)
RDW: 13.3 % (ref 11.5–15.5)
WBC: 4 10*3/uL (ref 4.0–10.5)
nRBC: 0 % (ref 0.0–0.2)

## 2019-10-26 LAB — BASIC METABOLIC PANEL
Anion gap: 7 (ref 5–15)
BUN: 17 mg/dL (ref 6–20)
CO2: 25 mmol/L (ref 22–32)
Calcium: 8.4 mg/dL — ABNORMAL LOW (ref 8.9–10.3)
Chloride: 106 mmol/L (ref 98–111)
Creatinine, Ser: 0.9 mg/dL (ref 0.44–1.00)
GFR calc Af Amer: >60 mL/min (ref >60)
GFR calc non Af Amer: >60 mL/min (ref >60)
Glucose, Bld: 122 mg/dL — ABNORMAL HIGH (ref 70–99)
Potassium: 3.5 mmol/L (ref 3.5–5.1)
Sodium: 138 mmol/L (ref 135–145)

## 2019-10-26 LAB — POCT PREGNANCY, URINE: Preg Test, Ur: NEGATIVE

## 2019-10-26 MED ORDER — TRANEXAMIC ACID 650 MG PO TABS: 1300.0000 mg | ORAL_TABLET | Freq: Two times a day (BID) | ORAL | 0 refills | Status: AC

## 2019-10-26 MED ORDER — ACETAMINOPHEN 500 MG PO TABS
1000.0000 mg | ORAL_TABLET | Freq: Once | ORAL | Status: AC
  Administered 2019-10-26: 10:00:00 1000 mg via ORAL
  Filled 2019-10-26: qty 2

## 2019-10-26 NOTE — ED Notes (Signed)
Patient transported to Ultrasound 

## 2019-10-26 NOTE — ED Triage Notes (Signed)
Patient reports noticed vaginal bleeding that started yesterday (reports has not had a menstrual in at least 5 years).  Reports she has has to change pants prior to coming to the ED.

## 2019-10-26 NOTE — ED Notes (Signed)
Pelvic cart at bedside. Pt is undressed from the waist down.

## 2019-10-26 NOTE — ED Provider Notes (Signed)
[facility] Emergency Department Provider Note  ____________________________________________   First MD Initiated Contact with Patient 10/26/19 [phone]     (approximate)  I have reviewed the triage vital signs and the nursing notes.   HISTORY  Chief Complaint Vaginal Bleeding    HPI Brittney Rojas is a 53 y.o. female with diabetes who presents with vaginal bleeding.  Patient states that she is not having vaginal bleeding for 6 years after stopping her menstruation.  However yesterday she started to have vaginal bleeding that was constant, severe, nothing makes it better, nothing makes it worse.  Says she is going through about 1 pad an hour.  She also has some abdominal cramping associated with it.  Denies any prior abdominal surgeries.  She denies any concerns for STDs.  Does not have an OB/GYN that she follows with.  Patient not on any blood thinners.  Denies shortness of breath, lightheadedness, fatigue.   Medical history: Diabetes   There are no active problems to display for this patient.   Past Surgical History:  Procedure Laterality Date   BREAST BIOPSY Left 08/11/2017   PSEUDO-ANGIOMATOUS STROMAL HYPERPLASIA    Prior to Admission medications   Medication Sig Start Date End Date Taking? Authorizing Provider  acetaminophen-codeine (TYLENOL #3) 300-30 MG tablet Take 1 tablet by mouth every 8 (eight) hours as needed for moderate pain. 12/03/16   Menshew, Dannielle Karvonen, PA-C  benzonatate (TESSALON PERLES) 100 MG capsule Take 1 capsule (100 mg total) by mouth 3 (three) times daily as needed for cough (Take 1-2 per dose). 12/03/16   Menshew, Dannielle Karvonen, PA-C  ondansetron (ZOFRAN ODT) 4 MG disintegrating tablet Take 1 tablet (4 mg total) by mouth every 6 (six) hours as needed for nausea or vomiting. 12/03/16   Menshew, Dannielle Karvonen, PA-C    Allergies Patient has no known allergies.  Family History  Problem Relation Age of Onset   Breast cancer  Paternal Aunt 4    Social History Social History   Tobacco Use   Smoking status: Never Smoker   Smokeless tobacco: Never Used  Substance Use Topics   Alcohol use: No   Drug use: No      Review of Systems Constitutional: No fever/chills Eyes: No visual changes. ENT: No sore throat. Cardiovascular: Denies chest pain. Respiratory: Denies shortness of breath. Gastrointestinal: No abdominal pain.  No nausea, no vomiting.  No diarrhea.  No constipation. Genitourinary: Negative for dysuria.  Positive vaginal bleeding Musculoskeletal: Negative for back pain. Skin: Negative for rash. Neurological: Negative for headaches, focal weakness or numbness. All other ROS negative ____________________________________________   PHYSICAL EXAM:  VITAL SIGNS: ED Triage Vitals  Enc Vitals Group     BP 10/26/19 0522 (!) 145/92     Pulse Rate 10/26/19 0522 69     Resp 10/26/19 0522 17     Temp 10/26/19 0522 99.4 F (37.4 C)     Temp Source 10/26/19 0522 Oral     SpO2 10/26/19 0522 98 %     Weight 10/26/19 0519 187 lb (84.8 kg)     Height 10/26/19 0519 5\' 4"  (1.626 m)     Head Circumference --      Peak Flow --      Pain Score 10/26/19 0519 8     Pain Loc --      Pain Edu? --      Excl. in [place]? --     Constitutional: Alert and oriented. Well appearing  and in no acute distress. Eyes: Conjunctivae are normal. EOMI. Head: Atraumatic. Nose: No congestion/rhinnorhea. Mouth/Throat: Mucous membranes are moist.   Neck: No stridor. Trachea Midline. FROM Cardiovascular: Normal rate, regular rhythm. Grossly normal heart sounds.  Good peripheral circulation. Respiratory: Normal respiratory effort.  No retractions. Lungs CTAB. Gastrointestinal: Soft and nontender. No distention. No abdominal bruits.  Musculoskeletal: No lower extremity tenderness nor edema.  No joint effusions. Neurologic:  Normal speech and language. No gross focal neurologic deficits are appreciated.  Skin:  Skin is  warm, dry and intact. No rash noted. Psychiatric: Mood and affect are normal. Speech and behavior are normal. GU: Pelvic exam with dark blood about 5 large swabs, no mass noted  ____________________________________________   LABS (all labs ordered are listed, but only abnormal results are displayed)  Labs Reviewed  BASIC METABOLIC PANEL - Abnormal; Notable for the following components:      Result Value   Glucose, Bld 122 (*)    Calcium 8.4 (*)    All other components within normal limits  CBC WITH DIFFERENTIAL/PLATELET  POC URINE PREG, ED   ____________________________________________   RADIOLOGY   Official radiology report(s): [place] Pelvic Complete With Transvaginal  Result Date: 10/26/2019 CLINICAL DATA:  Postmenopausal bleeding. EXAM: TRANSABDOMINAL AND TRANSVAGINAL ULTRASOUND OF PELVIS TECHNIQUE: Both transabdominal and transvaginal ultrasound examinations of the pelvis were performed. Transabdominal technique was performed for global imaging of the pelvis including uterus, ovaries, adnexal regions, and pelvic cul-de-sac. It was necessary to proceed with endovaginal exam following the transabdominal exam to visualize the endometrium and uterus. COMPARISON:  None FINDINGS: Uterus Measurements: 10.4 x 5.6 x 5.9 cm = volume: 181 mL. No fibroids or other mass visualized. Endometrium Thickness: 6.0 mm.  No focal abnormality visualized. Right ovary Measurements: 2.8 by 1.6 x 2.1 cm = volume: 5 mL. Normal appearance/no adnexal mass. Left ovary Measurements: 4.1 x 1.7 x 2.6 cm = volume: 9 mL. Normal appearance/no adnexal mass. Other findings No abnormal free fluid. IMPRESSION: Endometrial thickness measures 6 mm. In the setting of post-menopausal bleeding, endometrial sampling is indicated to exclude carcinoma. If results are benign, sonohysterogram should be considered for focal lesion work-up. (Ref: Radiological Reasoning: Algorithmic Workup of Abnormal Vaginal Bleeding with Endovaginal  Sonography and Sonohysterography. AJR 2008; 098:J19-14; 191:S68-73) Electronically Signed   By: Signa Kellaylor  Stroud M.D.   On: 10/26/2019 09:14    ____________________________________________   PROCEDURES  Procedure(s) performed (including Critical Care):  Procedures   ____________________________________________   INITIAL IMPRESSION / ASSESSMENT AND PLAN / ED COURSE  Brittney Rojas was evaluated in Emergency Department on 10/26/2019 for the symptoms described in the history of present illness. She was evaluated in the context of the global COVID-19 pandemic, which necessitated consideration that the patient might be at risk for infection with the SARS-CoV-2 virus that causes COVID-19. Institutional protocols and algorithms that pertain to the evaluation of patients at risk for COVID-19 are in a state of rapid change based on information released by regulatory bodies including the CDC and federal and state organizations. These policies and algorithms were followed during the patient's care in the ED.    Patient is a postmenopausal 53 year old who presents with vaginal bleeding.  Patient denies symptoms to suggest anemia but will get hemoglobin.  No blood thinners no prior history of heavy bleeding to suggest coagulopathy.  Will get transvaginal ultrasound to evaluate for fibroids versus uterine cancer.  Given the amount of bleeding will discuss with OB/GYN after performing pelvic exam to help quantify the amount of  bleeding.  Ultrasound shows endometrial thickness measuring 6 mm.  Will discuss with OB/GYN given the amount of bleeding although her hemoglobin is stable at this time could drop given her symptoms just started yesterday.  D/w Dr. Feliberto Gottron from OBGYN: recommend lysteda 1300mg  BID 5 days and he will follow up with pt early next week.  I rediscussed with patient she feels comfortable with going home.  She denies any risk factors for clotting.  She understands she is to return for symptoms of  anemia.  I discussed the provisional nature of ED diagnosis, the treatment so far, the ongoing plan of care, follow up appointments and return precautions with the patient and any family or support people present. They expressed understanding and agreed with the plan, discharged home.      ____________________________________________   FINAL CLINICAL IMPRESSION(S) / ED DIAGNOSES   Final diagnoses:  Vaginal bleeding      MEDICATIONS GIVEN DURING THIS VISIT:  Medications  acetaminophen (TYLENOL) tablet 1,000 mg (1,000 mg Oral Given 10/26/19 1006)     ED Discharge Orders         Ordered    tranexamic acid (LYSTEDA) 650 MG TABS tablet  2 times daily     10/26/19 1053           Note:  This document was prepared using Dragon voice recognition software and may include unintentional dictation errors.   14/05/20, MD 10/26/19 1055

## 2019-10-26 NOTE — Discharge Instructions (Signed)
I discussed her case with an OB doctor who recommended taking the prescribed medicine to help decrease her bleeding.  Return to the ER if you develop symptoms of anemia such as fatigue, shortness of breath, lightheadedness or any other concerns.  Otherwise call their office on Monday to get follow-up.   Endometrial thickness measures 6 mm. In the setting of post-menopausal bleeding, endometrial sampling is indicated to exclude carcinoma. If results are benign, sonohysterogram should be considered for focal lesion work-up. (Ref: Radiological Reasoning: Algorithmic Workup of Abnormal Vaginal Bleeding with Endovaginal Sonography and Sonohysterography. AJR 2008; 902:X11-55)

## 2020-01-10 NOTE — H&P (Signed)
Brittney Rojas is a 53 y.o. female here for Saline .pt with PMB , no bleeding in 6 yrs . ED referral  I saw her 11/25/2019 and embx  Yielded no endometrial tissue .       Past Medical History:  has a past medical history of Chickenpox, Diabetes mellitus without complication (CMS-HCC), and Hypertension.  Past Surgical History:  has no past surgical history on file. Family History: family history includes No Known Problems in her father and mother. Social History:  reports that she has never smoked. She has never used smokeless tobacco. She reports that she does not drink alcohol or use drugs. OB/GYN History:          OB History    Gravida  6   Para  4   Term  4   Preterm      AB  2   Living  4     SAB      TAB  1   Ectopic      Molar      Multiple      Live Births  4          Allergies: has No Known Allergies. Medications:  Current Outpatient Medications:  .  hydroCHLOROthiazide (MICROZIDE) 12.5 mg capsule, , Disp: , Rfl:  .  metFORMIN (GLUCOPHAGE) 500 MG tablet, , Disp: , Rfl:   Review of Systems: General:                      No fatigue or weight loss Eyes:                           No vision changes Ears:                            No hearing difficulty Respiratory:                No cough or shortness of breath Pulmonary:                  No asthma or shortness of breath Cardiovascular:           No chest pain, palpitations, dyspnea on exertion Gastrointestinal:          No abdominal bloating, chronic diarrhea, constipations, masses, pain or hematochezia Genitourinary:             No hematuria, dysuria, abnormal vaginal discharge, pelvic pain, Menometrorrhagia Lymphatic:                   No swollen lymph nodes Musculoskeletal:         No muscle weakness Neurologic:                  No extremity weakness, syncope, seizure disorder Psychiatric:                  No history of depression, delusions or suicidal/homicidal ideation    Exam:      Vitals:   01/13/2020  BP: (!) 167/91  Pulse: 83    Body mass index is 31.76 kg/m.  WDWN white/ black female in NAD   Lungs: CTA  CV : RRR without murmur   Breast: exam done in sitting and lying position : No dimpling or retraction, no dominant mass, no spontaneous discharge, no axillary adenopathy Neck:  no thyromegaly Abdomen: soft , no mass, normal active  bowel sounds,  non-tender, no rebound tenderness Pelvic: tanner stage 5 ,  External genitalia: vulva /labia no lesions Urethra: no prolapse Vagina: normal physiologic d/c, adequate room for vaginal hysterectomy if need be   Cervix: no lesions, no cervical motion tenderness   Uterus: normal size shape and contour, non-tender Adnexa: no mass,  non-tender   Rectovaginal:  Saline infusion sonohysterography: betadine prep to the cervix followed by placement of the HSG catheter into the endometrial canal . Sterile H2O is injected while performing a transvaginal u/s . Findings:1.6 x 1.0cm  endometrial polyp . Stripe 10 mm   Endometrial biopsy: repeat given last sample failed to show endometrial tissue  The cervix was cleaned with betadine and a single tooth tenaculum is applied to the anterior cervix. The Pipelle catheter was placed into the endometrial cavity. It sounds to 6.5 cm and adequate tissue was removed.   Impression:   The primary encounter diagnosis was PMB (postmenopausal bleeding). A diagnosis of Endometrial polyp was also pertinent to this visit.    Plan:   Reviewed results and I have recommended D+C ,  h/s , and myosure resection    I have explained the indication for the procedure and the procedure itself .     Vilma Prader, MD

## 2020-01-21 ENCOUNTER — Encounter
Admission: RE | Admit: 2020-01-21 | Discharge: 2020-01-21 | Disposition: A | Discharge status: Home / Self Care | Payer: Federal, State, Local not specified - PPO | Source: Ambulatory Visit | Attending: Obstetrics and Gynecology | Admitting: Obstetrics and Gynecology

## 2020-01-21 ENCOUNTER — Other Ambulatory Visit: Payer: Self-pay

## 2020-01-21 HISTORY — DX: Essential (primary) hypertension: I10

## 2020-01-21 HISTORY — DX: Type 2 diabetes mellitus without complications: E11.9

## 2020-01-21 NOTE — Patient Instructions (Addendum)
INSTRUCTIONS FOR SURGERY     Your surgery is scheduled for:   Monday, MARCH 8TH     To find out your arrival time for the day of surgery,          please call [phone] between 1 pm and 3 pm on :  Friday, MARCH 5TH     When you arrive for surgery, report to the [facility].       Do NOT stop on the first floor to register.    REMEMBER: Instructions that are not followed completely may result in serious medical risk,  up to and including death, or upon the discretion of your surgeon and anesthesiologist,            your surgery may need to be rescheduled.  __X__ 1. Do not eat food after midnight the night before your procedure.                    No gum, candy, lozenger, tic tacs, tums or hard candies.                  ABSOLUTELY NOTHING SOLID IN YOUR MOUTH AFTER MIDNIGHT                    You may drink unlimited clear liquids up to 2 hours before you are scheduled to arrive for surgery.                   Do not drink anything within those 2 hours unless you need to take medicine, then take the                   smallest amount you need.  Clear liquids include:  water, apple juice without pulp,                   any flavor Gatorade, Black coffee, black tea.  Sugar may be added but no dairy/ honey /lemon.                        Broth and jello is not considered a clear liquid.  __x__  2. On the morning of surgery, please brush your teeth with toothpaste and water. You may rinse with                  mouthwash if you wish but DO NOT SWALLOW TOOTHPASTE OR MOUTHWASH  __X___3. NO alcohol for 24 hours before or after surgery.  __x___ 4.  Do NOT smoke or use e-cigarettes for 24 HOURS PRIOR TO SURGERY.                      DO NOT Use any chewable tobacco products for at least 6 hours prior to surgery.  __x___ 5. If you start any new medication after this appointment and prior to surgery, please  Bring it with you on the day of surgery.  ___x__ 6. Notify your doctor if there is any change in your medical condition, such as fever,  infection, vomitting, diarrhea or any open sores.  __x___ 7.  USE ANTIBACTERIAL SOAP as instructed, the night before surgery and the day of surgery.                   Once you have washed with this soap, do NOT use any of the following: Powders, perfumes                    or lotions. Please do not wear make up, hairpins, clips or nail polish. You may  wear deodorant.                   Men may shave their face and neck.  Women need to shave 48 hours prior to surgery.                   DO NOT wear ANY jewelry on the day of surgery. If there are rings that are too tight to                    remove easily, please address this prior to the surgery day. Piercings need to be removed.                                                                     NO METAL ON YOUR BODY.                    Do NOT bring any valuables.  If you came to Pre-Admit testing then you will not need license,                     insurance card or credit card.  If you will be staying overnight, please either leave your things in                     the car or have your family be responsible for these items.                     [facility] IS NOT RESPONSIBLE FOR BELONGINGS OR VALUABLES.  ___X__ 8. DO NOT wear contact lenses on surgery day.  You may not have dentures,                     Hearing aides, contacts or glasses in the operating room. These items can be                    Placed in the Recovery Room to receive immediately after surgery.  __x___ 9. IF YOU ARE SCHEDULED TO GO HOME ON THE SAME DAY, YOU MUST                   Have someone to drive you home and to stay with you  for the first 24 hours.                    Have an arrangement prior to arriving on surgery day.  ___x__ 10. Take the following medications on the morning of surgery with a sip of water:  1.  NONE                     2.                     3.                                                  __X___ 11.  Follow any instructions provided to you by your surgeon.                        Such as enema, clear liquid bowel prep                     ###PLEASE DRINK THE CARBOHYDRATE PRE-SURGERY DRINK ON THE                             MORNING OF SURGERY. HAVE IT FINISHED BY 2 HOURS BEFORE                                ARRIVAL TO THE HOSPITAL###  __X__  12. STOP  ASPIRIN AS OF:  MARCH 2ND                       THIS INCLUDES BC POWDERS / GOODIES POWDER  __x___ 13. STOP Anti-inflammatories as of: MARCH 2ND                      This includes IBUPROFEN / MOTRIN / ADVIL / ALEVE/ NAPROXYN                    YOU MAY TAKE TYLENOL ANY TIME PRIOR TO SURGERY.  ___X__ 14.  Stop supplements until after surgery.                     This includes: BIOTIN                 You may continue taking Vitamin B12 / Vitamin D3 but do not take on the morning of surgery.  ___X___16.  Stop Metformin 2 full days prior to surgery.  Stop on: LAST DOSE ON EVENING OF 01/24/20                                     Do NOT take any diabetes medications on surgery day.  ___X___17.  Continue to take the following medications but do not take on the morning of surgery:                         HYDROCHLORATHIAZIDE  ______18.     Wear clean and comfortable clothing to the hospital. Loose fitting pants would be wise!!  Make sure to have contact numbers for your husband.

## 2020-01-23 ENCOUNTER — Other Ambulatory Visit: Payer: Self-pay

## 2020-01-23 ENCOUNTER — Encounter
Admission: RE | Admit: 2020-01-23 | Discharge: 2020-01-23 | Disposition: A | Discharge status: Home / Self Care | Payer: Federal, State, Local not specified - PPO | Source: Ambulatory Visit | Attending: Obstetrics and Gynecology | Admitting: Obstetrics and Gynecology

## 2020-01-23 DIAGNOSIS — Z01812 Encounter for preprocedural laboratory examination: Secondary | ICD-10-CM | POA: Diagnosis present

## 2020-01-23 DIAGNOSIS — Z20822 Contact with and (suspected) exposure to covid-19: Secondary | ICD-10-CM | POA: Insufficient documentation

## 2020-01-23 DIAGNOSIS — N840 Polyp of corpus uteri: Secondary | ICD-10-CM | POA: Diagnosis not present

## 2020-01-23 DIAGNOSIS — Z7984 Long term (current) use of oral hypoglycemic drugs: Secondary | ICD-10-CM | POA: Diagnosis not present

## 2020-01-23 DIAGNOSIS — I10 Essential (primary) hypertension: Secondary | ICD-10-CM | POA: Diagnosis not present

## 2020-01-23 DIAGNOSIS — E119 Type 2 diabetes mellitus without complications: Secondary | ICD-10-CM | POA: Diagnosis not present

## 2020-01-23 DIAGNOSIS — N950 Postmenopausal bleeding: Secondary | ICD-10-CM | POA: Diagnosis not present

## 2020-01-23 DIAGNOSIS — Z79899 Other long term (current) drug therapy: Secondary | ICD-10-CM | POA: Diagnosis not present

## 2020-01-23 LAB — CBC
HCT: 38.4 % (ref 36.0–46.0)
Hemoglobin: 12.8 g/dL (ref 12.0–15.0)
MCH: 30.3 pg (ref 26.0–34.0)
MCHC: 33.3 g/dL (ref 30.0–36.0)
MCV: 91 fL (ref 80.0–100.0)
Platelets: 246 10*3/uL (ref 150–400)
RBC: 4.22 MIL/uL (ref 3.87–5.11)
RDW: 12.9 % (ref 11.5–15.5)
WBC: 4.8 10*3/uL (ref 4.0–10.5)
nRBC: 0 % (ref 0.0–0.2)

## 2020-01-23 LAB — BASIC METABOLIC PANEL
Anion gap: 7 (ref 5–15)
BUN: 16 mg/dL (ref 6–20)
CO2: 29 mmol/L (ref 22–32)
Calcium: 8.8 mg/dL — ABNORMAL LOW (ref 8.9–10.3)
Chloride: 103 mmol/L (ref 98–111)
Creatinine, Ser: 0.91 mg/dL (ref 0.44–1.00)
GFR calc Af Amer: >60 mL/min (ref >60)
GFR calc non Af Amer: >60 mL/min (ref >60)
Glucose, Bld: 136 mg/dL — ABNORMAL HIGH (ref 70–99)
Potassium: 3.5 mmol/L (ref 3.5–5.1)
Sodium: 139 mmol/L (ref 135–145)

## 2020-01-23 LAB — TYPE AND SCREEN
ABO/RH(D): A POS
Antibody Screen: NEGATIVE

## 2020-01-23 LAB — SARS CORONAVIRUS 2 (TAT 6-24 HRS): SARS Coronavirus 2: NEGATIVE

## 2020-01-27 ENCOUNTER — Ambulatory Visit
Admission: RE | Admit: 2020-01-27 | Discharge: 2020-01-27 | Disposition: A | Discharge status: Home / Self Care | Payer: Federal, State, Local not specified - PPO | Attending: Obstetrics and Gynecology | Admitting: Obstetrics and Gynecology

## 2020-01-27 ENCOUNTER — Encounter: Payer: Self-pay | Admitting: Obstetrics and Gynecology

## 2020-01-27 ENCOUNTER — Ambulatory Visit: Payer: Federal, State, Local not specified - PPO | Admitting: Certified Registered Nurse Anesthetist

## 2020-01-27 ENCOUNTER — Encounter
Admission: RE | Disposition: A | Discharge status: Home / Self Care | Payer: Self-pay | Source: Home / Self Care | Attending: Obstetrics and Gynecology

## 2020-01-27 ENCOUNTER — Other Ambulatory Visit: Payer: Self-pay

## 2020-01-27 DIAGNOSIS — I10 Essential (primary) hypertension: Secondary | ICD-10-CM | POA: Insufficient documentation

## 2020-01-27 DIAGNOSIS — N840 Polyp of corpus uteri: Secondary | ICD-10-CM | POA: Insufficient documentation

## 2020-01-27 DIAGNOSIS — Z7984 Long term (current) use of oral hypoglycemic drugs: Secondary | ICD-10-CM | POA: Insufficient documentation

## 2020-01-27 DIAGNOSIS — Z79899 Other long term (current) drug therapy: Secondary | ICD-10-CM | POA: Insufficient documentation

## 2020-01-27 DIAGNOSIS — N950 Postmenopausal bleeding: Secondary | ICD-10-CM | POA: Insufficient documentation

## 2020-01-27 DIAGNOSIS — E119 Type 2 diabetes mellitus without complications: Secondary | ICD-10-CM | POA: Insufficient documentation

## 2020-01-27 HISTORY — PX: DILATATION & CURETTAGE/HYSTEROSCOPY WITH MYOSURE: SHX6511

## 2020-01-27 LAB — GLUCOSE, CAPILLARY
Glucose-Capillary: 67 mg/dL — ABNORMAL LOW (ref 70–99)
Glucose-Capillary: 67 mg/dL — ABNORMAL LOW (ref 70–99)
Glucose-Capillary: 96 mg/dL (ref 70–99)

## 2020-01-27 LAB — POCT PREGNANCY, URINE: Preg Test, Ur: NEGATIVE

## 2020-01-27 SURGERY — DILATATION & CURETTAGE/HYSTEROSCOPY WITH MYOSURE
Anesthesia: General

## 2020-01-27 MED ORDER — FENTANYL CITRATE (PF) 100 MCG/2ML IJ SOLN
INTRAMUSCULAR | Status: AC
  Filled 2020-01-27: qty 2

## 2020-01-27 MED ORDER — DEXAMETHASONE SODIUM PHOSPHATE 10 MG/ML IJ SOLN
INTRAMUSCULAR | Status: DC | PRN
  Administered 2020-01-27: 10 mg via INTRAVENOUS

## 2020-01-27 MED ORDER — GABAPENTIN 300 MG PO CAPS
ORAL_CAPSULE | ORAL | Status: AC
  Administered 2020-01-27: 300 mg via ORAL
  Filled 2020-01-27: qty 1

## 2020-01-27 MED ORDER — ONDANSETRON HCL 4 MG/2ML IJ SOLN: 4.0000 mg | Freq: Once | INTRAMUSCULAR | Status: DC | PRN

## 2020-01-27 MED ORDER — LIDOCAINE HCL (PF) 2 % IJ SOLN
INTRAMUSCULAR | Status: AC
  Filled 2020-01-27: qty 5

## 2020-01-27 MED ORDER — LACTATED RINGERS IV SOLN: INTRAVENOUS | Status: DC

## 2020-01-27 MED ORDER — SODIUM CHLORIDE 0.9 % IV SOLN: INTRAVENOUS | Status: DC

## 2020-01-27 MED ORDER — ACETAMINOPHEN 500 MG PO TABS
ORAL_TABLET | ORAL | Status: AC
  Administered 2020-01-27: 10:00:00 1000 mg via ORAL
  Filled 2020-01-27: qty 2

## 2020-01-27 MED ORDER — ONDANSETRON HCL 4 MG/2ML IJ SOLN
INTRAMUSCULAR | Status: DC | PRN
  Administered 2020-01-27: 4 mg via INTRAVENOUS

## 2020-01-27 MED ORDER — LIDOCAINE HCL (CARDIAC) PF 100 MG/5ML IV SOSY
PREFILLED_SYRINGE | INTRAVENOUS | Status: DC | PRN
  Administered 2020-01-27: 100 mg via INTRAVENOUS

## 2020-01-27 MED ORDER — CEFAZOLIN SODIUM-DEXTROSE 2-4 GM/100ML-% IV SOLN
2.0000 g | Freq: Once | INTRAVENOUS | Status: AC
  Administered 2020-01-27: 2 g via INTRAVENOUS

## 2020-01-27 MED ORDER — FAMOTIDINE 20 MG PO TABS: 20.0000 mg | ORAL_TABLET | Freq: Once | ORAL | Status: AC

## 2020-01-27 MED ORDER — SILVER NITRATE-POT NITRATE 75-25 % EX MISC
CUTANEOUS | Status: DC | PRN
  Administered 2020-01-27: 5 via TOPICAL

## 2020-01-27 MED ORDER — GABAPENTIN 300 MG PO CAPS: 300.0000 mg | ORAL_CAPSULE | ORAL | Status: AC

## 2020-01-27 MED ORDER — DEXTROSE-NACL 5-0.45 % IV SOLN: Freq: Once | INTRAVENOUS | Status: AC

## 2020-01-27 MED ORDER — KETOROLAC TROMETHAMINE 30 MG/ML IJ SOLN
INTRAMUSCULAR | Status: DC | PRN
  Administered 2020-01-27: 30 mg via INTRAVENOUS

## 2020-01-27 MED ORDER — PHENYLEPHRINE HCL (PRESSORS) 10 MG/ML IV SOLN
INTRAVENOUS | Status: DC | PRN
  Administered 2020-01-27: 100 ug via INTRAVENOUS

## 2020-01-27 MED ORDER — FENTANYL CITRATE (PF) 100 MCG/2ML IJ SOLN: 25.0000 ug | INTRAMUSCULAR | Status: DC | PRN

## 2020-01-27 MED ORDER — FAMOTIDINE 20 MG PO TABS
ORAL_TABLET | ORAL | Status: AC
  Administered 2020-01-27: 10:00:00 20 mg via ORAL
  Filled 2020-01-27: qty 1

## 2020-01-27 MED ORDER — ONDANSETRON 4 MG PO TBDP: 4.0000 mg | ORAL_TABLET | Freq: Four times a day (QID) | ORAL | Status: DC | PRN

## 2020-01-27 MED ORDER — PROPOFOL 10 MG/ML IV BOLUS
INTRAVENOUS | Status: AC
  Filled 2020-01-27: qty 20

## 2020-01-27 MED ORDER — CEFAZOLIN SODIUM-DEXTROSE 2-4 GM/100ML-% IV SOLN
INTRAVENOUS | Status: AC
  Filled 2020-01-27: qty 100

## 2020-01-27 MED ORDER — ACETAMINOPHEN 500 MG PO TABS: 1000.0000 mg | ORAL_TABLET | ORAL | Status: AC

## 2020-01-27 MED ORDER — FENTANYL CITRATE (PF) 100 MCG/2ML IJ SOLN
INTRAMUSCULAR | Status: DC | PRN
  Administered 2020-01-27 (×2): 25 ug via INTRAVENOUS
  Administered 2020-01-27: 50 ug via INTRAVENOUS

## 2020-01-27 MED ORDER — PROPOFOL 10 MG/ML IV BOLUS
INTRAVENOUS | Status: DC | PRN
  Administered 2020-01-27: 150 mg via INTRAVENOUS

## 2020-01-27 MED ORDER — MIDAZOLAM HCL 2 MG/2ML IJ SOLN
INTRAMUSCULAR | Status: AC
  Filled 2020-01-27: qty 2

## 2020-01-27 MED ORDER — MIDAZOLAM HCL 2 MG/2ML IJ SOLN
INTRAMUSCULAR | Status: DC | PRN
  Administered 2020-01-27: 2 mg via INTRAVENOUS

## 2020-01-27 MED ORDER — HYDROCODONE-ACETAMINOPHEN 5-325 MG PO TABS: 1.0000 | ORAL_TABLET | ORAL | Status: DC | PRN

## 2020-01-27 MED ORDER — ONDANSETRON HCL 4 MG/2ML IJ SOLN
INTRAMUSCULAR | Status: AC
  Filled 2020-01-27: qty 2

## 2020-01-27 MED ORDER — DEXAMETHASONE SODIUM PHOSPHATE 10 MG/ML IJ SOLN
INTRAMUSCULAR | Status: AC
  Filled 2020-01-27: qty 1

## 2020-01-27 SURGICAL SUPPLY — 21 items
CANISTER SUCT 3000ML PPV (MISCELLANEOUS) ×3 IMPLANT
CATH ROBINSON RED A/P 16FR (CATHETERS) ×3 IMPLANT
COVER WAND RF STERILE (DRAPES) IMPLANT
DEVICE MYOSURE LITE (MISCELLANEOUS) ×3 IMPLANT
DEVICE MYOSURE REACH (MISCELLANEOUS) IMPLANT
GLOVE BIO SURGEON STRL SZ8 (GLOVE) ×12 IMPLANT
GOWN STRL REUS W/ TWL LRG LVL3 (GOWN DISPOSABLE) ×1 IMPLANT
GOWN STRL REUS W/ TWL XL LVL3 (GOWN DISPOSABLE) ×1 IMPLANT
GOWN STRL REUS W/TWL LRG LVL3 (GOWN DISPOSABLE) ×2
GOWN STRL REUS W/TWL XL LVL3 (GOWN DISPOSABLE) ×2
KIT PROCEDURE FLUENT (KITS) ×3 IMPLANT
KIT TURNOVER CYSTO (KITS) ×3 IMPLANT
PACK DNC HYST (MISCELLANEOUS) ×3 IMPLANT
PAD OB MATERNITY 4.3X12.25 (PERSONAL CARE ITEMS) ×3 IMPLANT
PAD PREP 24X41 OB/GYN DISP (PERSONAL CARE ITEMS) ×3 IMPLANT
SEAL ROD LENS SCOPE MYOSURE (ABLATOR) ×3 IMPLANT
SOL .9 NS 3000ML IRR  AL (IV SOLUTION) ×2
SOL .9 NS 3000ML IRR UROMATIC (IV SOLUTION) ×1 IMPLANT
TOWEL OR 17X26 4PK STRL BLUE (TOWEL DISPOSABLE) ×3 IMPLANT
TUBING CONNECTING 10 (TUBING) IMPLANT
TUBING CONNECTING 10' (TUBING)

## 2020-01-27 NOTE — Anesthesia Postprocedure Evaluation (Signed)
Anesthesia Post Note  Patient: Nariah Morgano  Procedure(s) Performed: FRACTIONAL DILATATION & CURETTAGE/HYSTEROSCOPIC MYOSURE RESECTION OF ENDOMETRIAL SYNECHIAE (N/A )  Patient location during evaluation: PACU Anesthesia Type: General Level of consciousness: awake and alert Pain management: pain level controlled Vital Signs Assessment: post-procedure vital signs reviewed and stable Respiratory status: spontaneous breathing and respiratory function stable Cardiovascular status: stable Anesthetic complications: no     Last Vitals:  Vitals:   01/27/20 1124 01/27/20 1130  BP: 114/77 114/77  Pulse: 70 75  Resp: 13 12  Temp: 36.5 C 36.4 C  SpO2: 100% 100%    Last Pain:  Vitals:   01/27/20 1130  TempSrc: Temporal  PainSc:                  Makhia Vosler K

## 2020-01-27 NOTE — Op Note (Signed)
Brittney Rojas, RIEGER MEDICAL RECORD UU:[id number] ACCOUNT [id number] DATE OF BIRTH:08/15/1966 FACILITY: [facility] LOCATION: [facility] PHYSICIAN:[name], MD  OPERATIVE REPORT  DATE OF PROCEDURE:  01/27/2020  PREOPERATIVE DIAGNOSES: 1.  Postmenopausal bleeding. 2.  Endometrial polyp, 1.6 x 1 cm in measurement.  POSTOPERATIVE DIAGNOSES: 1.  Postmenopausal bleeding. 2.  Endometrial synechia.  PROCEDURES: 1.  Fractional dilation and curettage. 2.  Hysteroscopic removal of endometrial synechia with MyoSure.  SURGEON:  Jennell Corner, MD  ANESTHESIA:  General endotracheal anesthesia.  INDICATIONS:  A 54 year old female with postmenopausal bleeding.  The patient underwent a saline infusion sonohysterography in the office that showed a 1.6 x 1.0 cm endometrial polyp.  Endometrial biopsy did not show endometrial tissue.  DESCRIPTION OF PROCEDURE:  After adequate general endotracheal anesthesia, the patient was placed in dorsal supine position, hip in the candy cane stirrups.  The patient did receive 2 g of IV Ancef prior to commencement of the case.  Timeout was  performed.  Straight catheterization of the bladder yielded 150 mL of clear urine.  A weighted speculum was placed in the posterior vaginal vault and the anterior cervix was grasped with a single-tooth tenaculum.  Endocervical curettage was performed,  followed by uterine sounding to 8 cm.  Endometrial dilation with Hanks dilator to #17.  Hysteroscope was advanced into the endometrial cavity and there was a posterior to anterior synechia at the left lateral aspect of the uterus with approximately 5 mm.   The MyoSure resector was brought up and advanced into the endometrial cavity and the uterine synechia was removed without difficulty.  Good hemostasis was noted.  The cervix was then dilated to #18 Hanks dilator and an endometrial curettage was  performed.  These curettings will be sent with the previously biopsied  synechia.  Single- tooth tenaculum was removed.  Silver nitrate was used at the tenaculum sites.  Good hemostasis was noted.    COMPLICATIONS:  There were no complications.  ESTIMATED BLOOD LOSS:  Minimal.  INTRAOPERATIVE FLUIDS:  400 mL.  URINE OUTPUT:  150 mL.  DEFICIT:  Normal saline deficit 15 mL.  DISPOSITION:  The patient was taken to the recovery room in good condition.  VN/NUANCE  D:01/27/2020 T:01/27/2020 JOB:[id number]

## 2020-01-27 NOTE — Anesthesia Preprocedure Evaluation (Signed)
Anesthesia Evaluation  Patient identified by MRN, date of birth, ID band Patient awake    Reviewed: Allergy & Precautions, NPO status , Patient's Chart, lab work & pertinent test results  History of Anesthesia Complications Negative for: history of anesthetic complications  Airway Mallampati: II       Dental   Pulmonary neg sleep apnea, neg COPD, Not current smoker,           Cardiovascular hypertension, Pt. on medications (-) Past MI and (-) CHF (-) dysrhythmias (-) Valvular Problems/Murmurs     Neuro/Psych neg Seizures    GI/Hepatic Neg liver ROS, neg GERD  ,  Endo/Other  diabetes, Type 2, Oral Hypoglycemic Agents  Renal/GU negative Renal ROS     Musculoskeletal   Abdominal   Peds  Hematology   Anesthesia Other Findings   Reproductive/Obstetrics                             Anesthesia Physical Anesthesia Plan  ASA: III  Anesthesia Plan: General   Post-op Pain Management:    Induction: Intravenous  PONV Risk Score and Plan: 3 and Ondansetron, Midazolam and Treatment may vary due to age or medical condition  Airway Management Planned: LMA  Additional Equipment:   Intra-op Plan:   Post-operative Plan:   Informed Consent: I have reviewed the patients History and Physical, chart, labs and discussed the procedure including the risks, benefits and alternatives for the proposed anesthesia with the patient or authorized representative who has indicated his/her understanding and acceptance.       Plan Discussed with:   Anesthesia Plan Comments:         Anesthesia Quick Evaluation

## 2020-01-27 NOTE — Brief Op Note (Signed)
01/27/2020  11:16 AM  PATIENT:  Brittney Rojas  54 y.o. female  PRE-OPERATIVE DIAGNOSIS:  Postmenopausal bleeding; endometrial mass  POST-OPERATIVE DIAGNOSIS:  Postmenopausal bleeding; endometrial synechia PROCEDURE:  Procedure(s): FRACTIONAL DILATATION & CURETTAGE/HYSTEROSCOPY WITH MYOSURE RESECTION (N/A)  SURGEON:  Surgeon(s) and Role:    * Acxel Dingee, Ihor Austin, MD - Primary  PHYSICIAN ASSISTANT:   ASSISTANTS: none   ANESTHESIA:   general  EBL:  5 cc  400cc , urine 150 cc. deficit NS 15 cc  BLOOD ADMINISTERED:none  DRAINS: none   LOCAL MEDICATIONS USED:  NONE  SPECIMEN:  Source of Specimen:  ecc, endometrial curettings with endometrial synechia  DISPOSITION OF SPECIMEN:  PATHOLOGY  COUNTS:  YES  TOURNIQUET:  * No tourniquets in log *  DICTATION: .Other Dictation: Dictation Number verbal  PLAN OF CARE: Discharge to home after PACU  PATIENT DISPOSITION:  PACU - hemodynamically stable.   Delay start of Pharmacological VTE agent (>24hrs) due to surgical blood loss or risk of bleeding: not applicable

## 2020-01-27 NOTE — Discharge Instructions (Signed)

## 2020-01-27 NOTE — Transfer of Care (Signed)
Immediate Anesthesia Transfer of Care Note  Patient: Brittney Rojas  Procedure(s) Performed: FRACTIONAL DILATATION & CURETTAGE/HYSTEROSCOPIC MYOSURE RESECTION OF ENDOMETRIAL SYNECHIAE (N/A )  Patient Location: PACU  Anesthesia Type:General  Level of Consciousness: awake, alert  and oriented  Airway & Oxygen Therapy: Patient Spontanous Breathing and Patient connected to face mask oxygen  Post-op Assessment: Report given to RN and Post -op Vital signs reviewed and stable  Post vital signs: Reviewed and stable  Last Vitals:  Vitals Value Taken Time  BP 114/77 01/27/20 1130  Temp 36.4 C 01/27/20 1130  Pulse 77 01/27/20 1133  Resp 14 01/27/20 1133  SpO2 100 % 01/27/20 1133  Vitals shown include unvalidated device data.  Last Pain:  Vitals:   01/27/20 1130  TempSrc: Temporal  PainSc:          Complications: No apparent anesthesia complications

## 2020-01-27 NOTE — Anesthesia Procedure Notes (Signed)
Procedure Name: LMA Insertion Date/Time: 01/27/2020 10:40 AM Performed by: Ginger Carne, CRNA Pre-anesthesia Checklist: Patient identified, Emergency Drugs available, Suction available, Patient being monitored and Timeout performed Patient Re-evaluated:Patient Re-evaluated prior to induction Oxygen Delivery Method: Circle system utilized Preoxygenation: Pre-oxygenation with 100% oxygen Induction Type: IV induction LMA: LMA inserted LMA Size: 4.0 Tube type: Oral Number of attempts: 1 Placement Confirmation: positive ETCO2 and breath sounds checked- equal and bilateral Tube secured with: Tape Dental Injury: Teeth and Oropharynx as per pre-operative assessment

## 2020-01-27 NOTE — Progress Notes (Signed)
Pt ready for D+C , hysteroscopy and myosure resection of endometrial polyp  Labs reviewed . Neg HCG   proceed

## 2020-01-27 NOTE — Progress Notes (Signed)
Pt into PACU with IV out of arm. Notified Dr. Henrene Hawking who states may leave out IV unless pt becomes symptomatic, has pain or nausea.  Will continue to assess.

## 2020-01-28 LAB — ABO/RH: ABO/RH(D): A POS

## 2020-01-28 LAB — SURGICAL PATHOLOGY

## 2020-07-23 ENCOUNTER — Other Ambulatory Visit: Payer: Self-pay | Admitting: Physician Assistant

## 2020-07-23 DIAGNOSIS — Z1231 Encounter for screening mammogram for malignant neoplasm of breast: Secondary | ICD-10-CM

## 2020-08-17 ENCOUNTER — Other Ambulatory Visit: Payer: Self-pay

## 2020-08-17 ENCOUNTER — Ambulatory Visit
Admission: RE | Admit: 2020-08-17 | Discharge: 2020-08-17 | Disposition: A | Discharge status: Home / Self Care | Payer: Federal, State, Local not specified - PPO | Source: Ambulatory Visit | Attending: Physician Assistant | Admitting: Physician Assistant

## 2020-08-17 DIAGNOSIS — Z1231 Encounter for screening mammogram for malignant neoplasm of breast: Secondary | ICD-10-CM | POA: Insufficient documentation

## 2021-06-19 ENCOUNTER — Other Ambulatory Visit: Payer: Self-pay

## 2021-06-19 ENCOUNTER — Emergency Department
Admission: EM | Admit: 2021-06-19 | Discharge: 2021-06-19 | Disposition: A | Discharge status: Home / Self Care | Payer: Federal, State, Local not specified - PPO | Attending: Emergency Medicine | Admitting: Emergency Medicine

## 2021-06-19 DIAGNOSIS — E119 Type 2 diabetes mellitus without complications: Secondary | ICD-10-CM | POA: Insufficient documentation

## 2021-06-19 DIAGNOSIS — I10 Essential (primary) hypertension: Secondary | ICD-10-CM | POA: Diagnosis not present

## 2021-06-19 DIAGNOSIS — R030 Elevated blood-pressure reading, without diagnosis of hypertension: Secondary | ICD-10-CM | POA: Diagnosis present

## 2021-06-19 DIAGNOSIS — Z7984 Long term (current) use of oral hypoglycemic drugs: Secondary | ICD-10-CM | POA: Insufficient documentation

## 2021-06-19 DIAGNOSIS — Z79899 Other long term (current) drug therapy: Secondary | ICD-10-CM | POA: Diagnosis not present

## 2021-06-19 DIAGNOSIS — R42 Dizziness and giddiness: Secondary | ICD-10-CM | POA: Insufficient documentation

## 2021-06-19 LAB — CBC
HCT: 37.6 % (ref 36.0–46.0)
Hemoglobin: 13 g/dL (ref 12.0–15.0)
MCH: 31.1 pg (ref 26.0–34.0)
MCHC: 34.6 g/dL (ref 30.0–36.0)
MCV: 90 fL (ref 80.0–100.0)
Platelets: 258 10*3/uL (ref 150–400)
RBC: 4.18 MIL/uL (ref 3.87–5.11)
RDW: 13.2 % (ref 11.5–15.5)
WBC: 5.5 10*3/uL (ref 4.0–10.5)
nRBC: 0 % (ref 0.0–0.2)

## 2021-06-19 LAB — COMPREHENSIVE METABOLIC PANEL
ALT: 23 U/L (ref 0–44)
AST: 23 U/L (ref 15–41)
Albumin: 4.2 g/dL (ref 3.5–5.0)
Alkaline Phosphatase: 62 U/L (ref 38–126)
Anion gap: 10 (ref 5–15)
BUN: 18 mg/dL (ref 6–20)
CO2: 29 mmol/L (ref 22–32)
Calcium: 9.3 mg/dL (ref 8.9–10.3)
Chloride: 99 mmol/L (ref 98–111)
Creatinine, Ser: 0.97 mg/dL (ref 0.44–1.00)
GFR, Estimated: >60 mL/min (ref >60)
Glucose, Bld: 140 mg/dL — ABNORMAL HIGH (ref 70–99)
Potassium: 3.2 mmol/L — ABNORMAL LOW (ref 3.5–5.1)
Sodium: 138 mmol/L (ref 135–145)
Total Bilirubin: 0.7 mg/dL (ref 0.3–1.2)
Total Protein: 7.7 g/dL (ref 6.5–8.1)

## 2021-06-19 MED ORDER — SODIUM CHLORIDE 0.9 % IV SOLN
1000.0000 mL | Freq: Once | INTRAVENOUS | Status: AC
  Administered 2021-06-19: 1000 mL via INTRAVENOUS

## 2021-06-19 NOTE — ED Provider Notes (Signed)
[facility] Emergency Department Provider Note   ____________________________________________    I have reviewed the triage vital signs and the nursing notes.   HISTORY  Chief Complaint Hypertension and Dizziness     HPI Brittney Rojas is a 55 y.o. female who presents with complaints of high blood pressure and dizziness.  Patient reports this morning when she got out of bed she felt slightly lightheaded.  She reports this has been occurring intermittently over the last week.  She thinks it may be related to working in a very hot kitchen.  Denies neuro deficits.  No nausea or vomiting.  Reports compliance with her blood pressure medication.  Is concerned because she checked her blood pressure this morning and found it to be elevated, she did then take her morning blood pressure medication dose.  Currently is feeling much improved  Past Medical History:  Diagnosis Date   Diabetes mellitus without complication (HCC)    Hypertension     There are no problems to display for this patient.   Past Surgical History:  Procedure Laterality Date   BREAST BIOPSY Left 08/11/2017   PSEUDO-ANGIOMATOUS STROMAL HYPERPLASIA   DILATATION & CURETTAGE/HYSTEROSCOPY WITH MYOSURE N/A 01/27/2020   Procedure: FRACTIONAL DILATATION & CURETTAGE/HYSTEROSCOPIC MYOSURE RESECTION OF ENDOMETRIAL SYNECHIAE;  Surgeon: Suzy Bouchard, MD;  Location: [facility] ORS;  Service: Gynecology;  Laterality: N/A;    Prior to Admission medications   Medication Sig Start Date End Date Taking? Authorizing Provider  acetaminophen (TYLENOL) 500 MG tablet Take 500-1,000 mg by mouth every 6 (six) hours as needed (for pain.).    [provider]  Biotin 10 MG CAPS Take 10 mg by mouth daily.    [provider]  hydrochlorothiazide (MICROZIDE) 12.5 MG capsule Take 12.5 mg by mouth daily. 11/05/19   [provider]  metFORMIN (GLUCOPHAGE) 500 MG tablet Take 500 mg by mouth at  bedtime.    [provider]     Allergies Patient has no known allergies.  Family History  Problem Relation Age of Onset   Breast cancer Paternal Aunt 36    Social History Social History   Tobacco Use   Smoking status: Never   Smokeless tobacco: Never  Vaping Use   Vaping Use: Never used  Substance Use Topics   Alcohol use: No   Drug use: No    Review of Systems  Constitutional: No fever/chills Eyes: No visual changes.  ENT: No sore throat. Cardiovascular: Denies chest pain. Respiratory: Denies shortness of breath. Gastrointestinal: No abdominal pain.  No nausea, no vomiting.   Genitourinary: Negative for dysuria. Musculoskeletal: Negative for back pain. Skin: Negative for rash. Neurological: Negative for headaches or weakness   ____________________________________________   PHYSICAL EXAM:  VITAL SIGNS: ED Triage Vitals  Enc Vitals Group     BP 06/19/21 0840 (!) 162/99     Pulse Rate 06/19/21 0840 74     Resp 06/19/21 0840 16     Temp 06/19/21 0840 98.1 F (36.7 C)     Temp Source 06/19/21 0840 Oral     SpO2 06/19/21 0840 99 %     Weight 06/19/21 0838 81.6 kg (180 lb)     Height 06/19/21 0838 1.626 m (5\' 4" )     Head Circumference --      Peak Flow --      Pain Score 06/19/21 0838 0     Pain Loc --      Pain Edu? --  Excl. in GC? --     Constitutional: Alert and oriented.  Eyes: Conjunctivae are normal.  PERRLA Head: Atraumatic. Nose: No congestion/rhinnorhea. Mouth/Throat: Mucous membranes are moist.    Cardiovascular: Normal rate, regular rhythm. Grossly normal heart sounds.  Good peripheral circulation. Respiratory: Normal respiratory effort.  No retractions. Lungs CTAB. Gastrointestinal: Soft and nontender. No distention.   Musculoskeletal: Warm and well perfused Neurologic:  Normal speech and language. No gross focal neurologic deficits are appreciated.  Cranial nerves II through XII are normal, normal ambulation, normal  Romberg Skin:  Skin is warm, dry and intact. No rash noted. Psychiatric: Mood and affect are normal. Speech and behavior are normal.  ____________________________________________   LABS (all labs ordered are listed, but only abnormal results are displayed)  Labs Reviewed  COMPREHENSIVE METABOLIC PANEL - Abnormal; Notable for the following components:      Result Value   Potassium 3.2 (*)    Glucose, Bld 140 (*)    All other components within normal limits  CBC   ____________________________________________  EKG  ED ECG REPORT I, Jene Every, the attending physician, personally viewed and interpreted this ECG.  Date: 06/19/2021  Rhythm: normal sinus rhythm QRS Axis: normal Intervals: normal ST/T Wave abnormalities: normal Narrative Interpretation: no evidence of acute ischemia  ____________________________________________  RADIOLOGY None ____________________________________________   PROCEDURES  Procedure(s) performed: No  Procedures   Critical Care performed: No ____________________________________________   INITIAL IMPRESSION / ASSESSMENT AND PLAN / ED COURSE  Pertinent labs & imaging results that were available during my care of the patient were reviewed by me and considered in my medical decision making (see chart for details).   Patient presents with complaints of high blood pressure and dizziness, symptoms have improved/resolved at this time.  Exam is overall reassuring, EKG is unremarkable.  Will check CBC CMP, give IV fluids.  Lab work is reassuring, patient feeling improved, appropriate for discharge with outpatient follow-up.     ____________________________________________   FINAL CLINICAL IMPRESSION(S) / ED DIAGNOSES  Final diagnoses:  Primary hypertension  Dizziness        Note:  This document was prepared using Dragon voice recognition software and may include unintentional dictation errors.    Jene Every, MD 06/19/21  1240

## 2021-06-19 NOTE — ED Triage Notes (Signed)
Pt via POV from home. Pt states this AM she felt like she was having some dizziness. Denies NVD. Denies pain. Pt has a hx of HTN and took her medication this AM around 0430. Pt is A&Ox4 and ambulatory to room

## 2021-08-02 ENCOUNTER — Other Ambulatory Visit: Payer: Self-pay | Admitting: Family Medicine

## 2021-08-02 DIAGNOSIS — Z1231 Encounter for screening mammogram for malignant neoplasm of breast: Secondary | ICD-10-CM

## 2021-08-16 ENCOUNTER — Other Ambulatory Visit: Payer: Self-pay

## 2021-08-16 ENCOUNTER — Ambulatory Visit
Admission: RE | Admit: 2021-08-16 | Discharge: 2021-08-16 | Disposition: A | Discharge status: Home / Self Care | Payer: Federal, State, Local not specified - PPO | Source: Ambulatory Visit | Attending: Family Medicine | Admitting: Family Medicine

## 2021-08-16 DIAGNOSIS — Z1231 Encounter for screening mammogram for malignant neoplasm of breast: Secondary | ICD-10-CM

## 2022-06-01 ENCOUNTER — Other Ambulatory Visit: Payer: Self-pay | Admitting: Physician Assistant

## 2022-06-01 DIAGNOSIS — Z1231 Encounter for screening mammogram for malignant neoplasm of breast: Secondary | ICD-10-CM

## 2022-07-24 ENCOUNTER — Other Ambulatory Visit: Payer: Self-pay

## 2022-07-24 ENCOUNTER — Emergency Department
Admission: EM | Admit: 2022-07-24 | Discharge: 2022-07-24 | Disposition: A | Discharge status: Home / Self Care | Payer: Federal, State, Local not specified - PPO | Attending: Emergency Medicine | Admitting: Emergency Medicine

## 2022-07-24 DIAGNOSIS — M79645 Pain in left finger(s): Secondary | ICD-10-CM | POA: Diagnosis present

## 2022-07-24 DIAGNOSIS — L03012 Cellulitis of left finger: Secondary | ICD-10-CM | POA: Diagnosis not present

## 2022-07-24 MED ORDER — CEPHALEXIN 500 MG PO CAPS: 500.0000 mg | ORAL_CAPSULE | Freq: Three times a day (TID) | ORAL | 0 refills | Status: AC

## 2022-07-24 MED ORDER — PENTAFLUOROPROP-TETRAFLUOROETH EX AERO
INHALATION_SPRAY | CUTANEOUS | Status: DC | PRN
  Filled 2022-07-24 (×2): qty 30

## 2022-07-24 MED ORDER — CEPHALEXIN 500 MG PO CAPS
500.0000 mg | ORAL_CAPSULE | Freq: Once | ORAL | Status: AC
  Administered 2022-07-24: 500 mg via ORAL
  Filled 2022-07-24: qty 1

## 2022-07-24 MED ORDER — LIDOCAINE HCL (PF) 1 % IJ SOLN
5.0000 mL | Freq: Once | INTRAMUSCULAR | Status: AC
  Administered 2022-07-24: 5 mL
  Filled 2022-07-24: qty 5

## 2022-07-24 MED ORDER — HYDROCODONE-ACETAMINOPHEN 5-325 MG PO TABS
1.0000 | ORAL_TABLET | Freq: Once | ORAL | Status: AC
  Administered 2022-07-24: 1 via ORAL
  Filled 2022-07-24: qty 1

## 2022-07-24 NOTE — ED Triage Notes (Signed)
Pt arrives via POV from home with CC of L middle finger pain that began on 07/19/22 and has continued to get worse. Pt reports slamming finger tip in door. Finger appears swollen with purulent drainage around nail bed. Capillary refill and sensation intact at this time.

## 2022-07-24 NOTE — Discharge Instructions (Addendum)
Keep the wound clean, dry, and covered. Use warm salt water soaks to promote healing. Take the antibiotic as directed. Follow-up with your PCP as needed.

## 2022-07-24 NOTE — ED Provider Notes (Signed)
[facility] Emergency Department Provider Note     Event Date/Time   First MD Initiated Contact with Patient 07/24/22 1944     (approximate)   History   Finger Injury (Cuticle infection)   HPI  Brittney Rojas is a 56 y.o. female sent to the ED with complaints of continued left middle finger pain that began a 29.  She initially had a pinching injury across her nail when she got her finger caught in the car door.  She was seen by local urgent care provider who attempted [place] local I&D procedure of her paronychia.  She presents to the ED today with continued swelling To the Finger and Some Purulence from the cuticle.  She reports otherwise normal sensation and range of motion at this time.  She started empirically on Bactrim from that provider.     Physical Exam   Triage Vital Signs: ED Triage Vitals  Enc Vitals Group     BP 07/24/22 1925 (!) 149/93     Pulse Rate 07/24/22 1925 81     Resp 07/24/22 1925 16     Temp 07/24/22 1925 98 F (36.7 C)     Temp Source 07/24/22 1925 Oral     SpO2 07/24/22 1925 95 %     Weight 07/24/22 1925 182 lb (82.6 kg)     Height 07/24/22 1925 5\' 4"  (1.626 m)     Head Circumference --      Peak Flow --      Pain Score 07/24/22 1930 10     Pain Loc --      Pain Edu? --      Excl. in [place]? --     Most recent vital signs: Vitals:   07/24/22 1925  BP: (!) 149/93  Pulse: 81  Resp: 16  Temp: 98 F (36.7 C)  SpO2: 95%    General Awake, no distress.  CV:  Good peripheral perfusion.  RESP:  Normal effort.  ABD:  No distention.  MSK:  Left hand with normal composite fist.  Normal flexion range of motion noted to the DIP of the left middle finger.  Patient with some soft tissue swelling to the fingertip as well as some focal green purulence noted to the radial aspect of the cuticle.  Subungual hemorrhage or nail avulsion is noted.   ED Results / Procedures / Treatments   Labs (all labs ordered are listed, but only  abnormal results are displayed) Labs Reviewed - No data to display   EKG    RADIOLOGY  ED Provider Interpretation: }  No results found.   PROCEDURES:  Critical Care performed: No  ..Incision and Drainage  Date/Time: 07/24/2022 8:00 PM  Performed by: 09/23/2022, PA-C Authorized by: Lissa Hoard, PA-C   Consent:    Consent obtained:  Verbal   Consent given by:  Patient   Risks, benefits, and alternatives were discussed: yes     Risks discussed:  Bleeding, pain and incomplete drainage   Alternatives discussed:  Alternative treatment Universal protocol:    Site/side marked: yes     Patient identity confirmed:  Verbally with patient Location:    Indications for incision and drainage: paronychia.   Location:  Upper extremity   Upper extremity location:  Finger   Finger location:  L long finger Pre-procedure details:    Skin preparation:  Chlorhexidine with alcohol Sedation:    Sedation type:  None Anesthesia:    Anesthesia method:  transthecal block. Procedure type:    Complexity:  Simple Procedure details:    Ultrasound guidance: no     Needle aspiration: no     Incision types:  Single straight   Incision depth:  Subcutaneous   Drainage:  Purulent   Drainage amount:  Scant   Wound treatment:  Wound left open   Packing materials:  None Post-procedure details:    Procedure completion:  Tolerated well, no immediate complications    MEDICATIONS ORDERED IN ED: Medications  lidocaine (PF) (XYLOCAINE) 1 % injection 5 mL (5 mLs Infiltration Given by Other 07/24/22 2035)  cephALEXin (KEFLEX) capsule 500 mg (500 mg Oral Given 07/24/22 2035)  HYDROcodone-acetaminophen (NORCO/VICODIN) 5-325 MG per tablet 1 tablet (1 tablet Oral Given 07/24/22 2034)     IMPRESSION / MDM / ASSESSMENT AND PLAN / ED COURSE  I reviewed the triage vital signs and the nursing notes.                              Differential diagnosis includes, but is not limited to,  felon, paronychia, subungual hemorrhage, nail avulsion, finger laceration, finger dislocation, finger fracture, finger sprain  Patient's presentation is most consistent with acute complicated illness / injury requiring diagnostic workup.  Patient's diagnosis is consistent with paronychia and fingertip infection patient agrees to a local I&D procedure to drain the paronychia.  Patient will be discharged home with prescriptions for Keflex. Patient is to follow up with PCP or local urgent care as needed or otherwise directed. Patient is given ED precautions to return to the ED for any worsening or new symptoms.     FINAL CLINICAL IMPRESSION(S) / ED DIAGNOSES   Final diagnoses:  Paronychia of finger, left     Rx / DC Orders   ED Discharge Orders          Ordered    cephALEXin (KEFLEX) 500 MG capsule  3 times daily        07/24/22 1945             Note:  This document was prepared using Dragon voice recognition software and may include unintentional dictation errors.    Lissa Hoard, PA-C 07/25/22 2006    Pilar Jarvis, MD 07/26/22 [phone]

## 2022-08-22 ENCOUNTER — Ambulatory Visit
Admission: RE | Admit: 2022-08-22 | Discharge: 2022-08-22 | Disposition: A | Discharge status: Home / Self Care | Payer: Federal, State, Local not specified - PPO | Source: Ambulatory Visit | Attending: Physician Assistant | Admitting: Physician Assistant

## 2022-08-22 DIAGNOSIS — Z1231 Encounter for screening mammogram for malignant neoplasm of breast: Secondary | ICD-10-CM | POA: Insufficient documentation

## 2022-09-01 ENCOUNTER — Other Ambulatory Visit: Payer: Self-pay | Admitting: Primary Care

## 2022-09-01 DIAGNOSIS — N6489 Other specified disorders of breast: Secondary | ICD-10-CM

## 2022-09-01 DIAGNOSIS — R928 Other abnormal and inconclusive findings on diagnostic imaging of breast: Secondary | ICD-10-CM

## 2022-09-14 ENCOUNTER — Ambulatory Visit
Admission: RE | Admit: 2022-09-14 | Discharge: 2022-09-14 | Disposition: A | Discharge status: Home / Self Care | Payer: Federal, State, Local not specified - PPO | Source: Ambulatory Visit | Attending: Primary Care | Admitting: Primary Care

## 2022-09-14 DIAGNOSIS — N6489 Other specified disorders of breast: Secondary | ICD-10-CM | POA: Insufficient documentation

## 2022-09-14 DIAGNOSIS — R928 Other abnormal and inconclusive findings on diagnostic imaging of breast: Secondary | ICD-10-CM

## 2022-12-27 ENCOUNTER — Emergency Department
Admission: EM | Admit: 2022-12-27 | Discharge: 2022-12-27 | Disposition: A | Discharge status: Home / Self Care | Payer: Federal, State, Local not specified - PPO | Attending: Emergency Medicine | Admitting: Emergency Medicine

## 2022-12-27 ENCOUNTER — Emergency Department: Payer: Federal, State, Local not specified - PPO

## 2022-12-27 ENCOUNTER — Other Ambulatory Visit: Payer: Self-pay

## 2022-12-27 DIAGNOSIS — S29011A Strain of muscle and tendon of front wall of thorax, initial encounter: Secondary | ICD-10-CM | POA: Insufficient documentation

## 2022-12-27 DIAGNOSIS — Y990 Civilian activity done for income or pay: Secondary | ICD-10-CM | POA: Diagnosis not present

## 2022-12-27 DIAGNOSIS — X509XXA Other and unspecified overexertion or strenuous movements or postures, initial encounter: Secondary | ICD-10-CM | POA: Diagnosis not present

## 2022-12-27 DIAGNOSIS — R0789 Other chest pain: Secondary | ICD-10-CM

## 2022-12-27 DIAGNOSIS — I10 Essential (primary) hypertension: Secondary | ICD-10-CM | POA: Diagnosis not present

## 2022-12-27 DIAGNOSIS — E119 Type 2 diabetes mellitus without complications: Secondary | ICD-10-CM | POA: Diagnosis not present

## 2022-12-27 DIAGNOSIS — T148XXA Other injury of unspecified body region, initial encounter: Secondary | ICD-10-CM

## 2022-12-27 LAB — CBC
HCT: 37.8 % (ref 36.0–46.0)
Hemoglobin: 12.4 g/dL (ref 12.0–15.0)
MCH: 29.7 pg (ref 26.0–34.0)
MCHC: 32.8 g/dL (ref 30.0–36.0)
MCV: 90.6 fL (ref 80.0–100.0)
Platelets: 262 10*3/uL (ref 150–400)
RBC: 4.17 MIL/uL (ref 3.87–5.11)
RDW: 13.5 % (ref 11.5–15.5)
WBC: 6.1 10*3/uL (ref 4.0–10.5)
nRBC: 0 % (ref 0.0–0.2)

## 2022-12-27 LAB — BASIC METABOLIC PANEL
Anion gap: 7 (ref 5–15)
BUN: 20 mg/dL (ref 6–20)
CO2: 29 mmol/L (ref 22–32)
Calcium: 9.2 mg/dL (ref 8.9–10.3)
Chloride: 103 mmol/L (ref 98–111)
Creatinine, Ser: 1.01 mg/dL — ABNORMAL HIGH (ref 0.44–1.00)
GFR, Estimated: >60 mL/min (ref >60)
Glucose, Bld: 133 mg/dL — ABNORMAL HIGH (ref 70–99)
Potassium: 3.4 mmol/L — ABNORMAL LOW (ref 3.5–5.1)
Sodium: 139 mmol/L (ref 135–145)

## 2022-12-27 LAB — TROPONIN I (HIGH SENSITIVITY): Troponin I (High Sensitivity): 4 ng/L (ref <18)

## 2022-12-27 MED ORDER — LIDOCAINE 5 % EX PTCH
1.0000 | MEDICATED_PATCH | CUTANEOUS | Status: DC
  Administered 2022-12-27: 1 via TRANSDERMAL
  Filled 2022-12-27: qty 1

## 2022-12-27 MED ORDER — LIDOCAINE 5 % EX PTCH: 1.0000 | MEDICATED_PATCH | Freq: Two times a day (BID) | CUTANEOUS | 0 refills | Status: AC

## 2022-12-27 NOTE — ED Triage Notes (Signed)
Pt here with cp since Sun. Pt states pain is right sided and radiates to her back. Pt believes that she pulled a muscle and it is affecting her chest. Pt states pain is consistent.

## 2022-12-27 NOTE — ED Provider Notes (Signed)
[facility] Provider Note    Event Date/Time   First MD Initiated Contact with Patient 12/27/22 1336     (approximate)   History   Chief Complaint Chest Pain   HPI  Brittney Rojas is a 57 y.o. female with past medical history of hypertension and diabetes who presents to the ED complaining of chest pain.  Patient reports that she has had 2 days of constant bilateral chest pain that she describes as a dull ache.  She states that it seemed to come on after she made a large amount of biscuits at work, requiring her to use a rolling pin.  She reports her chest feels sore to the touch and pain is worse if she twists a certain way.  She denies any fevers, cough, or difficulty breathing, has not noticed any pain or swelling in her legs.  She has taken Tylenol at home with partial relief.     Physical Exam   Triage Vital Signs: ED Triage Vitals [12/27/22 1203]  Enc Vitals Group     BP (!) 158/107     Pulse Rate 86     Resp 20     Temp 97.6 F (36.4 C)     Temp Source Oral     SpO2 100 %     Weight 182 lb 1.6 oz (82.6 kg)     Height 5\' 4"  (1.626 m)     Head Circumference      Peak Flow      Pain Score 10     Pain Loc      Pain Edu?      Excl. in [place]?     Most recent vital signs: Vitals:   12/27/22 1203 12/27/22 1400  BP: (!) 158/107 (!) 156/99  Pulse: 86 84  Resp: 20 18  Temp: 97.6 F (36.4 C) 97.6 F (36.4 C)  SpO2: 100% 100%    Constitutional: Alert and oriented. Eyes: Conjunctivae are normal. Head: Atraumatic. Nose: No congestion/rhinnorhea. Mouth/Throat: Mucous membranes are moist.  Cardiovascular: Normal rate, regular rhythm. Grossly normal heart sounds.  2+ radial pulses bilaterally. Respiratory: Normal respiratory effort.  No retractions. Lungs CTAB.  Right chest wall tenderness to palpation noted. Gastrointestinal: Soft and nontender. No distention. Musculoskeletal: No lower extremity tenderness nor edema.  Neurologic:  Normal speech  and language. No gross focal neurologic deficits are appreciated.    ED Results / Procedures / Treatments   Labs (all labs ordered are listed, but only abnormal results are displayed) Labs Reviewed  BASIC METABOLIC PANEL - Abnormal; Notable for the following components:      Result Value   Potassium 3.4 (*)    Glucose, Bld 133 (*)    Creatinine, Ser 1.01 (*)    All other components within normal limits  CBC  TROPONIN I (HIGH SENSITIVITY)     EKG  ED ECG REPORT I, Blake Divine, the attending physician, personally viewed and interpreted this ECG.   Date: 12/27/2022  EKG Time: 12:03  Rate: 82  Rhythm: normal sinus rhythm  Axis: LAD  Intervals:none  ST&T Change: None  RADIOLOGY Chest x-ray reviewed and interpreted by me with no infiltrate, edema, or effusion.  PROCEDURES:  Critical Care performed: No  Procedures   MEDICATIONS ORDERED IN ED: Medications  lidocaine (LIDODERM) 5 % 1 patch (1 patch Transdermal Patch Applied 12/27/22 1401)     IMPRESSION / MDM / ASSESSMENT AND PLAN / ED COURSE  I reviewed the triage vital signs and  the nursing notes.                              57 y.o. female with past medical history of hypertension and diabetes presents to the ED with constant chest pain over the past 2 days which is worse with certain positional changes.  Patient's presentation is most consistent with acute presentation with potential threat to life or bodily function.  Differential diagnosis includes, but is not limited to, ACS, PE, dissection, pneumonia, pneumothorax, musculoskeletal pain, GERD, and anxiety.  Patient well-appearing and in no acute distress, vital signs are unremarkable.  EKG shows no evidence of arrhythmia or ischemia and troponin is negative, doubt ACS given atypical symptoms that have been present for 2 days.  Remainder of labs are reassuring with no significant anemia, leukocytosis, electrolyte abnormality, or AKI.  Chest x-ray is negative  for acute process, suspect musculoskeletal etiology for her symptoms given pain is reproducible with palpation.  We will treat with Lidoderm patch and patient is appropriate for discharge home with PCP follow-up.  She was counseled to return to the ED for new or worsening symptoms, patient agrees with plan.      FINAL CLINICAL IMPRESSION(S) / ED DIAGNOSES   Final diagnoses:  Atypical chest pain  Muscle strain     Rx / DC Orders   ED Discharge Orders          Ordered    lidocaine (LIDODERM) 5 %  Every 12 hours        12/27/22 1359             Note:  This document was prepared using Dragon voice recognition software and may include unintentional dictation errors.   Blake Divine, MD 12/27/22 1515

## 2023-02-10 ENCOUNTER — Other Ambulatory Visit: Payer: Self-pay | Admitting: Physician Assistant

## 2023-02-10 DIAGNOSIS — N63 Unspecified lump in unspecified breast: Secondary | ICD-10-CM

## 2023-03-20 ENCOUNTER — Ambulatory Visit
Admission: RE | Admit: 2023-03-20 | Discharge: 2023-03-20 | Disposition: A | Discharge status: Home / Self Care | Payer: Federal, State, Local not specified - PPO | Source: Ambulatory Visit | Attending: Physician Assistant | Admitting: Physician Assistant

## 2023-03-20 DIAGNOSIS — N63 Unspecified lump in unspecified breast: Secondary | ICD-10-CM | POA: Insufficient documentation

## 2023-04-24 IMAGING — MG MM DIGITAL SCREENING BILAT W/ TOMO AND CAD
8 series · 8 of 24 positions shown · non-contrast
Comparison: Previous exam(s).

CLINICAL DATA: Screening.

EXAM:
DIGITAL SCREENING BILATERAL MAMMOGRAM WITH TOMOSYNTHESIS AND CAD
TECHNIQUE: Bilateral screening digital craniocaudal and mediolateral oblique
mammograms were obtained. Bilateral screening digital breast
tomosynthesis was performed. The images were evaluated with
computer-aided detection.

[L CC synth-2D]
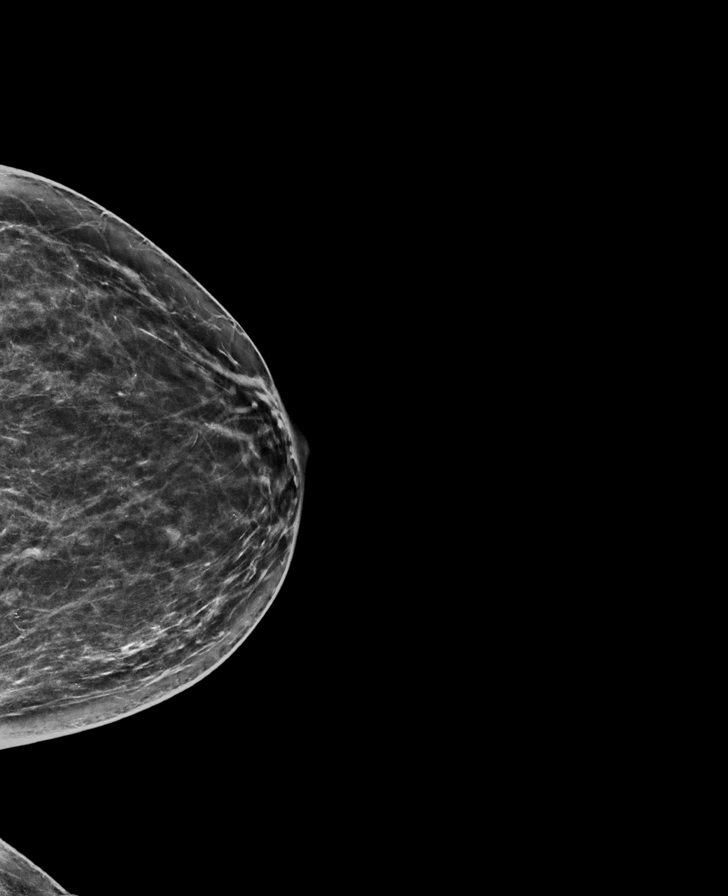

[R MLO synth-2D]
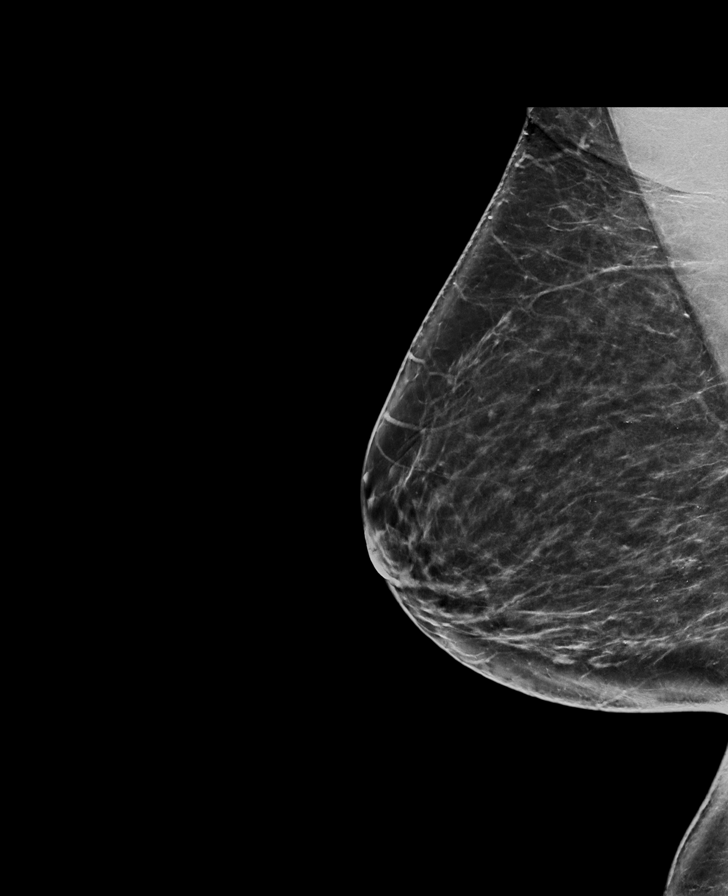

[R CC synth-2D]
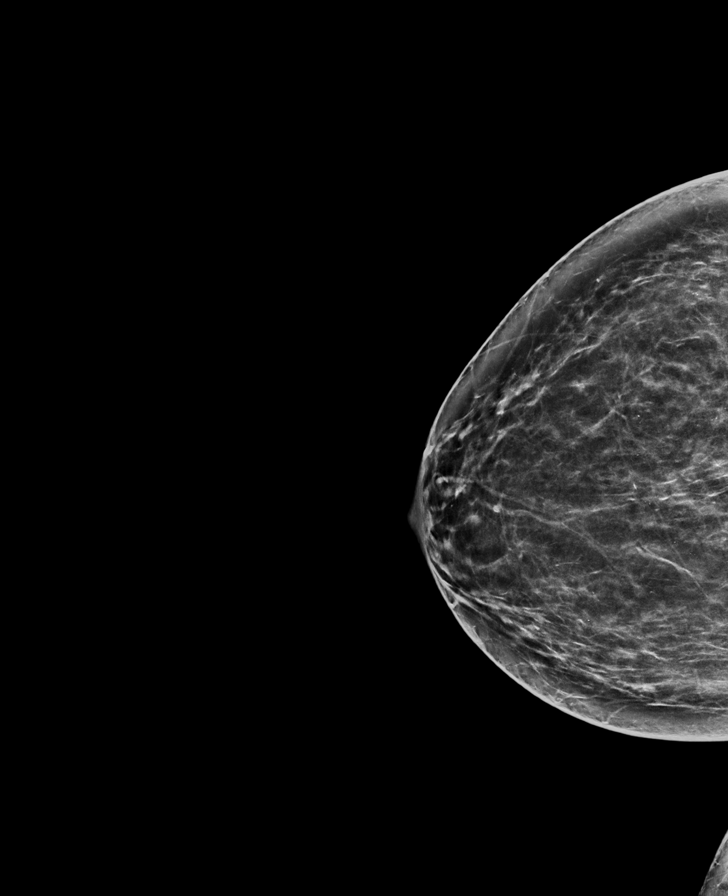

[L MLO synth-2D]
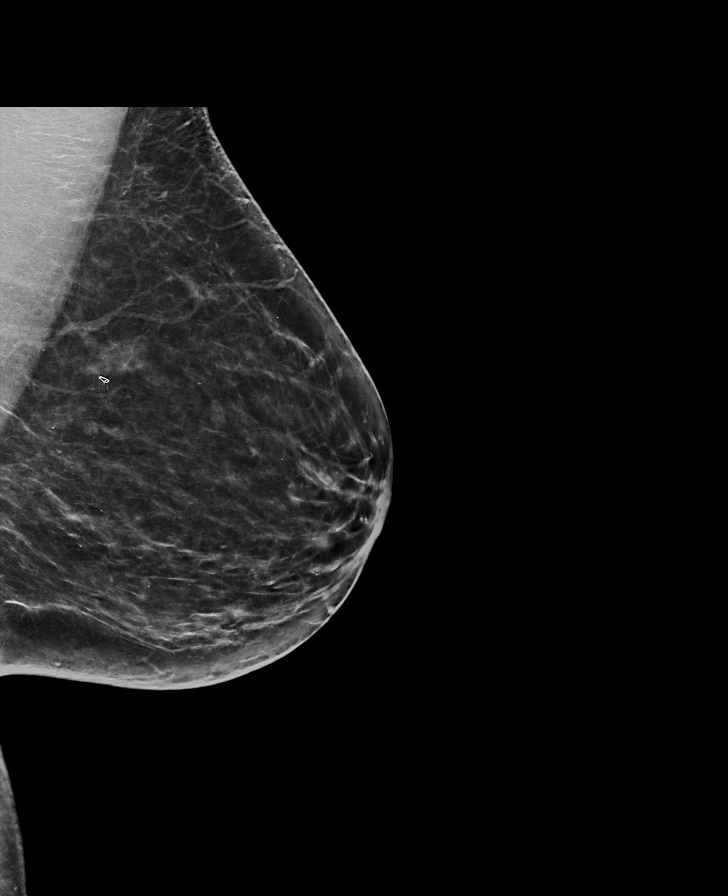

[R CC tomo · tomo slice 37/72.0]
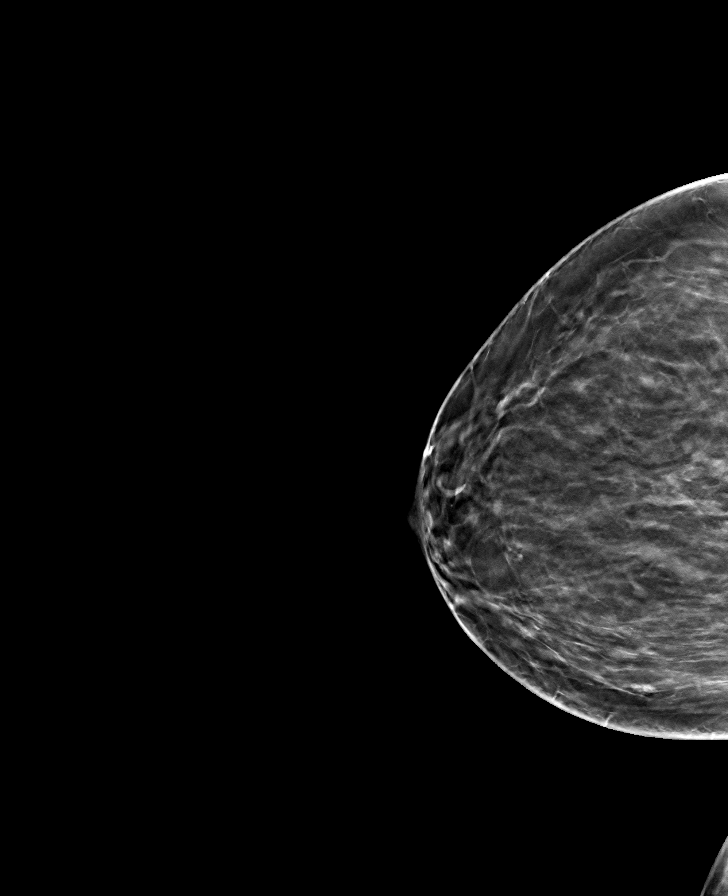

[L MLO tomo · tomo slice 39/77.0]
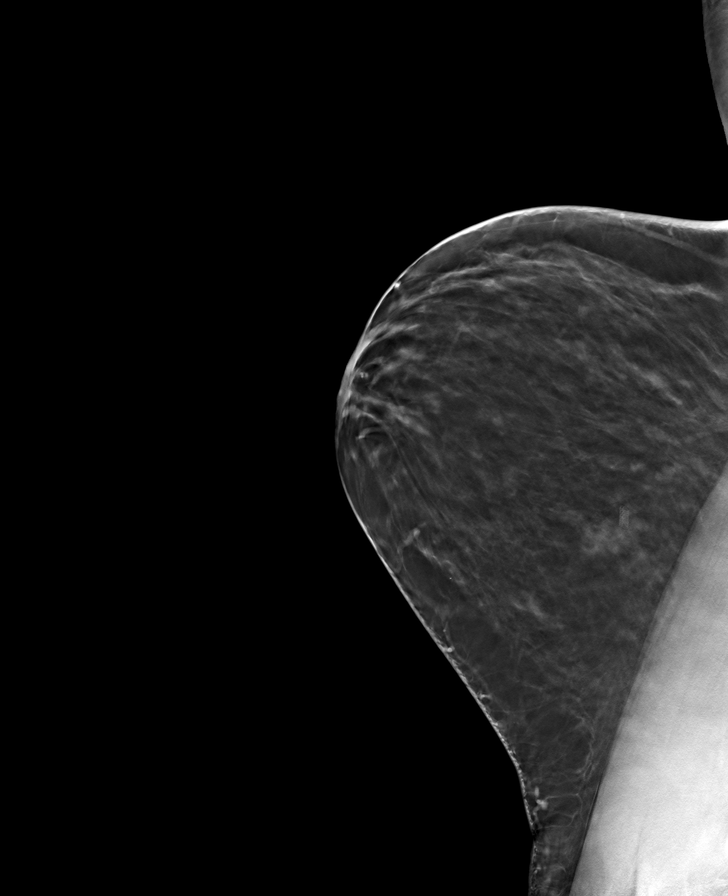

[R MLO tomo · tomo slice 39/77.0]
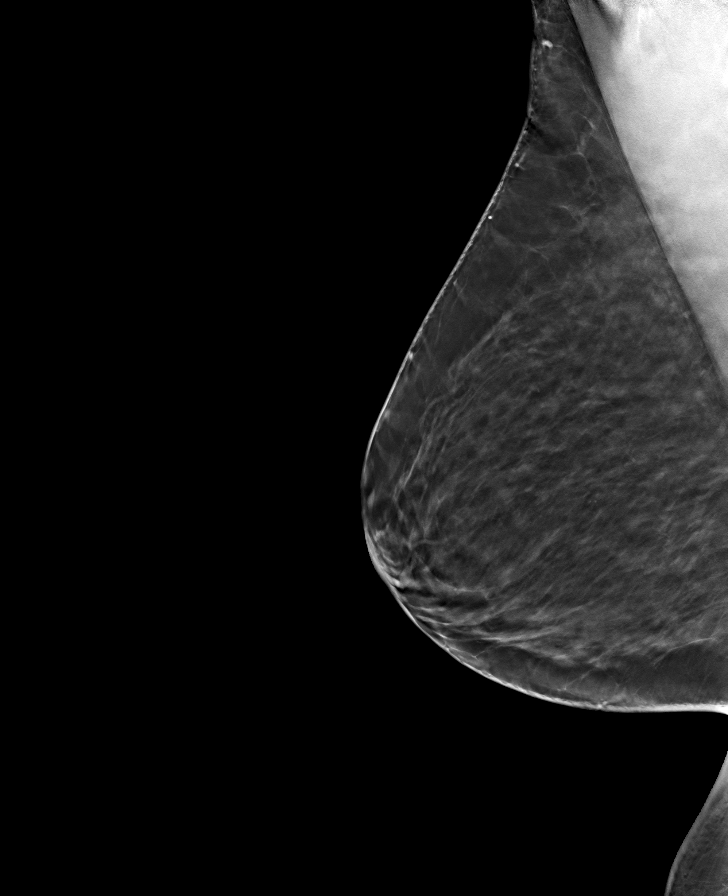

[L CC tomo · tomo slice 35/69.0]
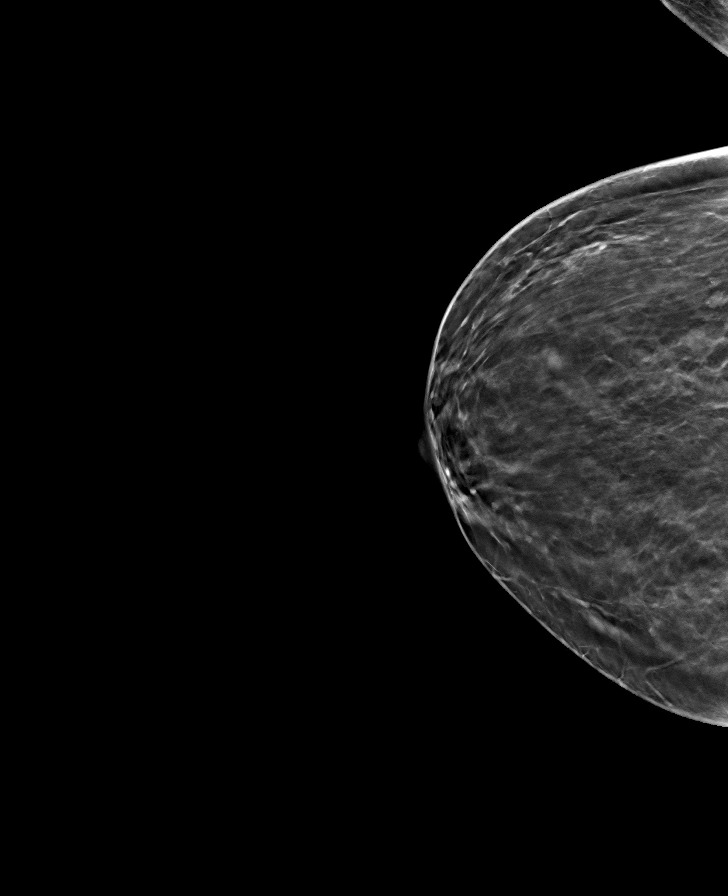

[8 of 24 positions shown; findings below may reference images not displayed]

ACR Breast Density Category b: There are scattered areas of
fibroglandular density.
FINDINGS: There are no findings suspicious for malignancy.
IMPRESSION: No mammographic evidence of malignancy. A result letter of this
screening mammogram will be mailed directly to the patient.

RECOMMENDATION:
Screening mammogram in one year. (Code:51-O-LD2)

BI-RADS CATEGORY  1: Negative.

## 2023-07-05 ENCOUNTER — Other Ambulatory Visit: Payer: Self-pay

## 2023-07-05 ENCOUNTER — Emergency Department: Payer: Federal, State, Local not specified - PPO

## 2023-07-05 ENCOUNTER — Encounter: Payer: Self-pay | Admitting: *Deleted

## 2023-07-05 ENCOUNTER — Emergency Department
Admission: EM | Admit: 2023-07-05 | Discharge: 2023-07-05 | Disposition: A | Discharge status: Home / Self Care | Payer: Federal, State, Local not specified - PPO | Attending: Emergency Medicine | Admitting: Emergency Medicine

## 2023-07-05 DIAGNOSIS — Z79899 Other long term (current) drug therapy: Secondary | ICD-10-CM | POA: Diagnosis not present

## 2023-07-05 DIAGNOSIS — R0789 Other chest pain: Secondary | ICD-10-CM | POA: Diagnosis present

## 2023-07-05 DIAGNOSIS — I10 Essential (primary) hypertension: Secondary | ICD-10-CM | POA: Insufficient documentation

## 2023-07-05 DIAGNOSIS — R0781 Pleurodynia: Secondary | ICD-10-CM | POA: Insufficient documentation

## 2023-07-05 DIAGNOSIS — R079 Chest pain, unspecified: Secondary | ICD-10-CM

## 2023-07-05 DIAGNOSIS — E119 Type 2 diabetes mellitus without complications: Secondary | ICD-10-CM | POA: Insufficient documentation

## 2023-07-05 LAB — BASIC METABOLIC PANEL
Anion gap: 9 (ref 5–15)
BUN: 23 mg/dL — ABNORMAL HIGH (ref 6–20)
CO2: 28 mmol/L (ref 22–32)
Calcium: 8.8 mg/dL — ABNORMAL LOW (ref 8.9–10.3)
Chloride: 103 mmol/L (ref 98–111)
Creatinine, Ser: 0.96 mg/dL (ref 0.44–1.00)
GFR, Estimated: >60 mL/min (ref >60)
Glucose, Bld: 188 mg/dL — ABNORMAL HIGH (ref 70–99)
Potassium: 3.4 mmol/L — ABNORMAL LOW (ref 3.5–5.1)
Sodium: 140 mmol/L (ref 135–145)

## 2023-07-05 LAB — CBC
HCT: 37.9 % (ref 36.0–46.0)
Hemoglobin: 12.3 g/dL (ref 12.0–15.0)
MCH: 29.6 pg (ref 26.0–34.0)
MCHC: 32.5 g/dL (ref 30.0–36.0)
MCV: 91.3 fL (ref 80.0–100.0)
Platelets: 251 10*3/uL (ref 150–400)
RBC: 4.15 MIL/uL (ref 3.87–5.11)
RDW: 13.7 % (ref 11.5–15.5)
WBC: 6.1 10*3/uL (ref 4.0–10.5)
nRBC: 0 % (ref 0.0–0.2)

## 2023-07-05 LAB — TROPONIN I (HIGH SENSITIVITY)
Troponin I (High Sensitivity): 5 ng/L (ref <18)
Troponin I (High Sensitivity): 5 ng/L (ref <18)

## 2023-07-05 MED ORDER — LIDOCAINE 5 % EX PTCH
1.0000 | MEDICATED_PATCH | Freq: Once | CUTANEOUS | Status: DC
  Administered 2023-07-05: 1 via TRANSDERMAL
  Filled 2023-07-05: qty 1

## 2023-07-05 NOTE — Discharge Instructions (Addendum)
Your exam and labs, including your EKG and CXR are reassuring.  No signs of a serious cardiac cause for your chest wall pain.  Symptoms may be due to musculoskeletal strain, versus a possible adverse side effect to your olmesartan and blood pressure medicine.  You should take OTC Tylenol as needed and monitor your symptoms.  Follow with primary provider if you feel the symptoms are consistent with a medication side effect.  Return to the ED if needed.

## 2023-07-05 NOTE — ED Triage Notes (Signed)
Pt here for pain in her left ribs (left mid axillary line) which began yesterday. It increases with movement

## 2023-07-05 NOTE — ED Provider Notes (Signed)
[facility] Emergency Department Provider Note     Event Date/Time   First MD Initiated Contact with Patient 07/05/23 Rickey Primus     (approximate)   History   Chest Pain   HPI  Brittney Rojas is a 57 y.o. female with a history of diabetes and hypertension, presents to the ED for evaluation of left-sided chest wall pain.  Patient would endorse left-sided chest and rib pain without preceding injury, trauma, or fall.  Denies any shortness of breath, cough, congestion, or central/substernal chest pain.  Patient would note that her hypertension medication was recently changed from HCTZ to olmesartan-HCTZ.  Patient took the first dose of the ARB medication yesterday afternoon before noting the onset of her chest wall pain that evening.  Physical Exam   Triage Vital Signs: ED Triage Vitals  Encounter Vitals Group     BP 07/05/23 1617 (!) 145/110     Systolic BP Percentile --      Diastolic BP Percentile --      Pulse Rate 07/05/23 1617 90     Resp 07/05/23 1617 16     Temp 07/05/23 1617 98.4 F (36.9 C)     Temp Source 07/05/23 1617 Oral     SpO2 07/05/23 1617 100 %     Weight --      Height --      Head Circumference --      Peak Flow --      Pain Score 07/05/23 1620 8     Pain Loc --      Pain Education --      Exclude from Growth Chart --     Most recent vital signs: Vitals:   07/05/23 1804 07/05/23 1942  BP: (!) 152/101 (!) 137/107  Pulse: 89 73  Resp: 16 16  Temp: 98.4 F (36.9 C)   SpO2: 100% 95%    General Awake, no distress. NAD HEENT NCAT. PERRL. EOMI. No rhinorrhea. Mucous membranes are moist.  CV:  Good peripheral perfusion. RRR RESP:  Normal effort. CTA ABD:  No distention.  MSK:  Active range of motion of the upper lower extremities. SKIN:  No appreciable rash noted to the chest wall   ED Results / Procedures / Treatments   Labs (all labs ordered are listed, but only abnormal results are displayed) Labs Reviewed  BASIC  METABOLIC PANEL - Abnormal; Notable for the following components:      Result Value   Potassium 3.4 (*)    Glucose, Bld 188 (*)    BUN 23 (*)    Calcium 8.8 (*)    All other components within normal limits  CBC  TROPONIN I (HIGH SENSITIVITY)  TROPONIN I (HIGH SENSITIVITY)     EKG  Vent. rate 82 BPM PR interval 188 ms QRS duration 76 ms QT/QTcB 386/450 ms P-R-T axes 68 -62 43 Normal sinus rhythm Possible Left atrial enlargement Left axis deviation  RADIOLOGY  I personally viewed and evaluated these images as part of my medical decision making, as well as reviewing the written report by the radiologist.  ED Provider Interpretation: No acute findings  DG Chest 2 View  Result Date: 07/05/2023 CLINICAL DATA:  Left-sided chest pain and rib pain EXAM: CHEST - 2 VIEW COMPARISON:  12/27/2022 FINDINGS: The heart size and mediastinal contours are within normal limits. Both lungs are clear. No consolidation, pneumothorax or effusion. No edema. The visualized skeletal structures are unremarkable. Degenerative changes along the spine. IMPRESSION: No acute  cardiopulmonary disease Electronically Signed   By: Karen Kays M.D.   On: 07/05/2023 16:47     PROCEDURES:  Critical Care performed: No  Procedures   MEDICATIONS ORDERED IN ED: Medications  lidocaine (LIDODERM) 5 % 1 patch (1 patch Transdermal Patch Applied 07/05/23 1939)     IMPRESSION / MDM / ASSESSMENT AND PLAN / ED COURSE  I reviewed the triage vital signs and the nursing notes.                              Differential diagnosis includes, but is not limited to, ACS, aortic dissection, pulmonary embolism, cardiac tamponade, pneumothorax, pneumonia, pericarditis, myocarditis, GI-related causes including esophagitis/gastritis, and musculoskeletal chest wall pain.    Patient's presentation is most consistent with acute complicated illness / injury requiring diagnostic workup.  Patient's diagnosis is consistent with  nonspecific chest pain, likely musculoskeletal chest pain.  Patient with reassuring normal cardiac workup at this time without evidence of elevated troponin, electrolyte abnormalities, or critical anemia.  Chest is without evidence of any thoracic process.  EKG without evidence of malignant arrhythmia.  This may likely be due to a musculoskeletal etiology including possible med side effect from the ARB medication.  Patient will be discharged home with directions to take OTC Tylenol as needed and consider use of OTC Lidoderm patches. Patient is to follow up with PCP for ongoing symptoms, as needed or otherwise directed. Patient is given ED precautions to return to the ED for any worsening or new symptoms.  FINAL CLINICAL IMPRESSION(S) / ED DIAGNOSES   Final diagnoses:  Chest wall pain  Nonspecific chest pain     Rx / DC Orders   ED Discharge Orders     None        Note:  This document was prepared using Dragon voice recognition software and may include unintentional dictation errors.    Adolphe Fortunato, Charlesetta Ivory, PA-C 07/06/23 0000    Janith Lima, MD 07/06/23 [phone]

## 2023-08-02 ENCOUNTER — Other Ambulatory Visit: Payer: Self-pay | Admitting: Physician Assistant

## 2023-08-03 ENCOUNTER — Other Ambulatory Visit: Payer: Self-pay | Admitting: Physician Assistant

## 2023-08-03 DIAGNOSIS — N63 Unspecified lump in unspecified breast: Secondary | ICD-10-CM

## 2023-08-24 ENCOUNTER — Ambulatory Visit
Admission: RE | Admit: 2023-08-24 | Discharge: 2023-08-24 | Disposition: A | Discharge status: Home / Self Care | Payer: Federal, State, Local not specified - PPO | Source: Ambulatory Visit | Attending: Physician Assistant | Admitting: Physician Assistant

## 2023-08-24 DIAGNOSIS — N63 Unspecified lump in unspecified breast: Secondary | ICD-10-CM

## 2024-09-03 ENCOUNTER — Other Ambulatory Visit: Payer: Self-pay | Admitting: Family Medicine

## 2024-09-03 ENCOUNTER — Other Ambulatory Visit: Payer: Self-pay | Admitting: Physician Assistant

## 2024-09-03 DIAGNOSIS — Z1231 Encounter for screening mammogram for malignant neoplasm of breast: Secondary | ICD-10-CM

## 2024-10-03 ENCOUNTER — Encounter

## 2024-10-30 ENCOUNTER — Ambulatory Visit
Admission: RE | Admit: 2024-10-30 | Discharge: 2024-10-30 | Disposition: A | Discharge status: Home / Self Care | Source: Ambulatory Visit | Attending: Physician Assistant | Admitting: Physician Assistant

## 2024-10-30 DIAGNOSIS — Z1231 Encounter for screening mammogram for malignant neoplasm of breast: Secondary | ICD-10-CM | POA: Insufficient documentation
